# Patient Record
Sex: Male | Born: 1970 | Race: White | Hispanic: No | Marital: Single | Smoking: Current every day smoker
Health system: Southern US, Community
[De-identification: ages and names within clinical notes are randomized; demographics above are authoritative.]

## PROBLEM LIST (undated history)

## (undated) DIAGNOSIS — I10 Essential (primary) hypertension: Secondary | ICD-10-CM

## (undated) DIAGNOSIS — I639 Cerebral infarction, unspecified: Secondary | ICD-10-CM

---

## 2020-12-07 ENCOUNTER — Emergency Department (HOSPITAL_COMMUNITY): Payer: PRIVATE HEALTH INSURANCE

## 2020-12-07 ENCOUNTER — Inpatient Hospital Stay (HOSPITAL_COMMUNITY)
Admission: EM | Admit: 2020-12-07 | Discharge: 2020-12-09 | DRG: 975 | Disposition: A | Discharge status: Home / Self Care | Payer: PRIVATE HEALTH INSURANCE | Attending: Internal Medicine | Admitting: Internal Medicine

## 2020-12-07 ENCOUNTER — Other Ambulatory Visit: Payer: Self-pay

## 2020-12-07 ENCOUNTER — Encounter (HOSPITAL_COMMUNITY): Payer: Self-pay

## 2020-12-07 DIAGNOSIS — Z91128 Patient's intentional underdosing of medication regimen for other reason: Secondary | ICD-10-CM | POA: Diagnosis not present

## 2020-12-07 DIAGNOSIS — E785 Hyperlipidemia, unspecified: Secondary | ICD-10-CM

## 2020-12-07 DIAGNOSIS — I10 Essential (primary) hypertension: Secondary | ICD-10-CM | POA: Diagnosis present

## 2020-12-07 DIAGNOSIS — F419 Anxiety disorder, unspecified: Secondary | ICD-10-CM

## 2020-12-07 DIAGNOSIS — Z8673 Personal history of transient ischemic attack (TIA), and cerebral infarction without residual deficits: Secondary | ICD-10-CM

## 2020-12-07 DIAGNOSIS — A419 Sepsis, unspecified organism: Principal | ICD-10-CM | POA: Diagnosis present

## 2020-12-07 DIAGNOSIS — Z20822 Contact with and (suspected) exposure to covid-19: Secondary | ICD-10-CM | POA: Diagnosis present

## 2020-12-07 DIAGNOSIS — Z21 Asymptomatic human immunodeficiency virus [HIV] infection status: Secondary | ICD-10-CM

## 2020-12-07 DIAGNOSIS — T368X6A Underdosing of other systemic antibiotics, initial encounter: Secondary | ICD-10-CM | POA: Diagnosis present

## 2020-12-07 DIAGNOSIS — T50996A Underdosing of other drugs, medicaments and biological substances, initial encounter: Secondary | ICD-10-CM | POA: Diagnosis present

## 2020-12-07 DIAGNOSIS — E872 Acidosis: Secondary | ICD-10-CM | POA: Diagnosis present

## 2020-12-07 DIAGNOSIS — J189 Pneumonia, unspecified organism: Secondary | ICD-10-CM | POA: Diagnosis present

## 2020-12-07 DIAGNOSIS — F17210 Nicotine dependence, cigarettes, uncomplicated: Secondary | ICD-10-CM | POA: Diagnosis present

## 2020-12-07 DIAGNOSIS — Z720 Tobacco use: Secondary | ICD-10-CM | POA: Diagnosis not present

## 2020-12-07 DIAGNOSIS — B370 Candidal stomatitis: Secondary | ICD-10-CM | POA: Diagnosis present

## 2020-12-07 DIAGNOSIS — Z79899 Other long term (current) drug therapy: Secondary | ICD-10-CM

## 2020-12-07 DIAGNOSIS — B20 Human immunodeficiency virus [HIV] disease: Secondary | ICD-10-CM | POA: Diagnosis present

## 2020-12-07 DIAGNOSIS — Y92009 Unspecified place in unspecified non-institutional (private) residence as the place of occurrence of the external cause: Secondary | ICD-10-CM

## 2020-12-07 DIAGNOSIS — R6520 Severe sepsis without septic shock: Secondary | ICD-10-CM | POA: Diagnosis present

## 2020-12-07 HISTORY — DX: Essential (primary) hypertension: I10

## 2020-12-07 HISTORY — DX: Cerebral infarction, unspecified: I63.9

## 2020-12-07 LAB — LACTIC ACID, PLASMA
Lactic Acid, Venous: 2.3 mmol/L (ref 0.5–1.9)
Lactic Acid, Venous: 2.2 mmol/L (ref 0.5–1.9)

## 2020-12-07 LAB — CBC WITH DIFFERENTIAL/PLATELET
Abs Immature Granulocytes: 0.15 10*3/uL — ABNORMAL HIGH (ref 0.00–0.07)
Basophils Absolute: 0 10*3/uL (ref 0.0–0.1)
Basophils Relative: 0 %
Eosinophils Absolute: 0.1 10*3/uL (ref 0.0–0.5)
Eosinophils Relative: 0 %
HCT: 46.2 % (ref 39.0–52.0)
Hemoglobin: 16.2 g/dL (ref 13.0–17.0)
Immature Granulocytes: 1 %
Lymphocytes Relative: 5 %
Lymphs Abs: 0.8 10*3/uL (ref 0.7–4.0)
MCH: 32.5 pg (ref 26.0–34.0)
MCHC: 35.1 g/dL (ref 30.0–36.0)
MCV: 92.8 fL (ref 80.0–100.0)
Monocytes Absolute: 1.2 10*3/uL — ABNORMAL HIGH (ref 0.1–1.0)
Monocytes Relative: 7 %
Neutro Abs: 14.2 10*3/uL — ABNORMAL HIGH (ref 1.7–7.7)
Neutrophils Relative %: 87 %
Platelets: 132 10*3/uL — ABNORMAL LOW (ref 150–400)
RBC: 4.98 MIL/uL (ref 4.22–5.81)
RDW: 13.2 % (ref 11.5–15.5)
WBC: 16.5 10*3/uL — ABNORMAL HIGH (ref 4.0–10.5)
nRBC: 0 % (ref 0.0–0.2)

## 2020-12-07 LAB — URINALYSIS, ROUTINE W REFLEX MICROSCOPIC
Bacteria, UA: NONE SEEN
Bilirubin Urine: NEGATIVE
Glucose, UA: NEGATIVE mg/dL
Ketones, ur: NEGATIVE mg/dL
Leukocytes,Ua: NEGATIVE
Nitrite: NEGATIVE
Protein, ur: NEGATIVE mg/dL
Specific Gravity, Urine: 1.013 (ref 1.005–1.030)
pH: 5 (ref 5.0–8.0)

## 2020-12-07 LAB — EXPECTORATED SPUTUM ASSESSMENT W GRAM STAIN, RFLX TO RESP C

## 2020-12-07 LAB — COMPREHENSIVE METABOLIC PANEL
ALT: 40 U/L (ref 0–44)
AST: 55 U/L — ABNORMAL HIGH (ref 15–41)
Albumin: 3.9 g/dL (ref 3.5–5.0)
Alkaline Phosphatase: 57 U/L (ref 38–126)
Anion gap: 9 (ref 5–15)
BUN: 6 mg/dL (ref 6–20)
CO2: 22 mmol/L (ref 22–32)
Calcium: 8.6 mg/dL — ABNORMAL LOW (ref 8.9–10.3)
Chloride: 101 mmol/L (ref 98–111)
Creatinine, Ser: 0.86 mg/dL (ref 0.61–1.24)
GFR, Estimated: >60 mL/min (ref >60)
Glucose, Bld: 102 mg/dL — ABNORMAL HIGH (ref 70–99)
Potassium: 3.9 mmol/L (ref 3.5–5.1)
Sodium: 132 mmol/L — ABNORMAL LOW (ref 135–145)
Total Bilirubin: 0.7 mg/dL (ref 0.3–1.2)
Total Protein: 8.9 g/dL — ABNORMAL HIGH (ref 6.5–8.1)

## 2020-12-07 LAB — MRSA PCR SCREENING: MRSA by PCR: NEGATIVE

## 2020-12-07 LAB — HIV ANTIBODY (ROUTINE TESTING W REFLEX): HIV Screen 4th Generation wRfx: REACTIVE — AB

## 2020-12-07 LAB — RESP PANEL BY RT-PCR (FLU A&B, COVID) ARPGX2
Influenza A by PCR: NEGATIVE
Influenza B by PCR: NEGATIVE
SARS Coronavirus 2 by RT PCR: NEGATIVE

## 2020-12-07 LAB — T-HELPER CELLS (CD4) COUNT (NOT AT ARMC)
CD4 % Helper T Cell: 7 % — ABNORMAL LOW (ref 33–65)
CD4 T Cell Abs: 83 /uL — ABNORMAL LOW (ref 400–1790)

## 2020-12-07 LAB — APTT: aPTT: 30 seconds (ref 24–36)

## 2020-12-07 LAB — PROTIME-INR
INR: 1 (ref 0.8–1.2)
Prothrombin Time: 13 seconds (ref 11.4–15.2)

## 2020-12-07 MED ORDER — NYSTATIN 100000 UNIT/ML MT SUSP
5.0000 mL | Freq: Four times a day (QID) | OROMUCOSAL | Status: DC
  Administered 2020-12-07 – 2020-12-08 (×7): 500000 [IU] via ORAL
  Filled 2020-12-07 (×7): qty 5

## 2020-12-07 MED ORDER — SODIUM CHLORIDE 0.9 % IV SOLN
500.0000 mg | INTRAVENOUS | Status: DC
  Administered 2020-12-08: 500 mg via INTRAVENOUS
  Filled 2020-12-07 (×2): qty 500

## 2020-12-07 MED ORDER — SODIUM CHLORIDE 0.9 % IV SOLN
1.0000 g | Freq: Once | INTRAVENOUS | Status: AC
  Administered 2020-12-07: 1 g via INTRAVENOUS
  Filled 2020-12-07: qty 10

## 2020-12-07 MED ORDER — SODIUM CHLORIDE 0.9 % IV BOLUS
250.0000 mL | Freq: Once | INTRAVENOUS | Status: AC
  Administered 2020-12-07: 250 mL via INTRAVENOUS

## 2020-12-07 MED ORDER — METOPROLOL TARTRATE 5 MG/5ML IV SOLN
2.5000 mg | Freq: Once | INTRAVENOUS | Status: AC
  Administered 2020-12-07: 2.5 mg via INTRAVENOUS
  Filled 2020-12-07: qty 5

## 2020-12-07 MED ORDER — HYDROXYZINE PAMOATE 25 MG PO CAPS
25.0000 mg | ORAL_CAPSULE | Freq: Three times a day (TID) | ORAL | Status: DC
  Filled 2020-12-07 (×2): qty 1

## 2020-12-07 MED ORDER — HYDROXYZINE HCL 25 MG PO TABS
25.0000 mg | ORAL_TABLET | Freq: Three times a day (TID) | ORAL | Status: DC
  Administered 2020-12-07 – 2020-12-08 (×5): 25 mg via ORAL
  Filled 2020-12-07 (×5): qty 1

## 2020-12-07 MED ORDER — SODIUM CHLORIDE 0.9 % IV SOLN: 2.0000 g | INTRAVENOUS | Status: DC

## 2020-12-07 MED ORDER — ORAL CARE MOUTH RINSE
15.0000 mL | Freq: Two times a day (BID) | OROMUCOSAL | Status: DC
  Administered 2020-12-07 – 2020-12-08 (×4): 15 mL via OROMUCOSAL

## 2020-12-07 MED ORDER — SODIUM CHLORIDE 0.9 % IV SOLN
500.0000 mg | Freq: Once | INTRAVENOUS | Status: AC
  Administered 2020-12-07: 500 mg via INTRAVENOUS
  Filled 2020-12-07: qty 500

## 2020-12-07 MED ORDER — ENOXAPARIN SODIUM 40 MG/0.4ML ~~LOC~~ SOLN
40.0000 mg | SUBCUTANEOUS | Status: DC
  Administered 2020-12-07 – 2020-12-08 (×2): 40 mg via SUBCUTANEOUS
  Filled 2020-12-07 (×2): qty 0.4

## 2020-12-07 MED ORDER — DARUN-COBIC-EMTRICIT-TENOFAF 800-150-200-10 MG PO TABS
1.0000 | ORAL_TABLET | Freq: Every day | ORAL | Status: DC
  Administered 2020-12-08: 1 via ORAL
  Filled 2020-12-07 (×2): qty 1

## 2020-12-07 MED ORDER — SODIUM CHLORIDE 0.9 % IV SOLN
2.0000 g | Freq: Three times a day (TID) | INTRAVENOUS | Status: DC
  Administered 2020-12-07 – 2020-12-09 (×6): 2 g via INTRAVENOUS
  Filled 2020-12-07 (×7): qty 2

## 2020-12-07 MED ORDER — NICOTINE 21 MG/24HR TD PT24
21.0000 mg | MEDICATED_PATCH | Freq: Every day | TRANSDERMAL | Status: DC
  Administered 2020-12-07 – 2020-12-08 (×2): 21 mg via TRANSDERMAL
  Filled 2020-12-07 (×2): qty 1

## 2020-12-07 MED ORDER — VANCOMYCIN HCL 1500 MG/300ML IV SOLN
1500.0000 mg | Freq: Once | INTRAVENOUS | Status: AC
  Administered 2020-12-07: 1500 mg via INTRAVENOUS
  Filled 2020-12-07: qty 300

## 2020-12-07 MED ORDER — SODIUM CHLORIDE 0.9 % IV BOLUS
2000.0000 mL | Freq: Once | INTRAVENOUS | Status: AC
  Administered 2020-12-07: 2000 mL via INTRAVENOUS

## 2020-12-07 MED ORDER — ALBUTEROL SULFATE HFA 108 (90 BASE) MCG/ACT IN AERS
2.0000 | INHALATION_SPRAY | RESPIRATORY_TRACT | Status: DC | PRN
  Filled 2020-12-07: qty 6.7

## 2020-12-07 MED ORDER — ACETAMINOPHEN 325 MG PO TABS
650.0000 mg | ORAL_TABLET | Freq: Once | ORAL | Status: AC
  Administered 2020-12-07: 650 mg via ORAL
  Filled 2020-12-07: qty 2

## 2020-12-07 MED ORDER — ONDANSETRON HCL 4 MG/2ML IJ SOLN
4.0000 mg | Freq: Three times a day (TID) | INTRAMUSCULAR | Status: DC | PRN
  Filled 2020-12-07: qty 2

## 2020-12-07 MED ORDER — VANCOMYCIN HCL 1250 MG/250ML IV SOLN
1250.0000 mg | Freq: Two times a day (BID) | INTRAVENOUS | Status: DC
  Administered 2020-12-08: 1250 mg via INTRAVENOUS
  Filled 2020-12-07 (×2): qty 250

## 2020-12-07 MED ORDER — SODIUM CHLORIDE 0.9 % IV BOLUS (SEPSIS)
500.0000 mL | Freq: Once | INTRAVENOUS | Status: AC
  Administered 2020-12-07: 500 mL via INTRAVENOUS

## 2020-12-07 MED ORDER — ATORVASTATIN CALCIUM 40 MG PO TABS
80.0000 mg | ORAL_TABLET | Freq: Every day | ORAL | Status: DC
  Administered 2020-12-07 – 2020-12-08 (×2): 80 mg via ORAL
  Filled 2020-12-07 (×2): qty 2

## 2020-12-07 MED ORDER — METOPROLOL TARTRATE 5 MG/5ML IV SOLN
5.0000 mg | Freq: Three times a day (TID) | INTRAVENOUS | Status: DC | PRN
  Administered 2020-12-07: 5 mg via INTRAVENOUS
  Filled 2020-12-07 (×2): qty 5

## 2020-12-07 NOTE — ED Notes (Signed)
Abnormal lab: Lactic Acid 2.3 Notified Dr. Estell Harpin

## 2020-12-07 NOTE — Progress Notes (Signed)
   12/07/20 1448  Vitals  Temp 99.6 F (37.6 C)  BP (!) 166/97  MAP (mmHg) 115  BP Location Left Arm  BP Method Automatic  Patient Position (if appropriate) Lying  Pulse Rate (!) 122  Pulse Rate Source Monitor  Resp 18  Oxygen Therapy  SpO2 96 %  MEWS Score  MEWS Temp 0  MEWS Systolic 0  MEWS Pulse 2  MEWS RR 0  MEWS LOC 0  MEWS Score 2  MEWS Score Color Yellow    Pt HR increased again. MD aware and advised Metoprolol 5mg  PRN for HR greater than 120. Pt remains asymptomatic. Metoprolol given and I will continue to monitor.        12/07/2020,3:06 PM

## 2020-12-07 NOTE — Progress Notes (Signed)
   12/07/20 1600  Vitals  ECG Heart Rate (!) 118  Resp (!) 25  MEWS Score  MEWS Temp 0  MEWS Systolic 0  MEWS Pulse 2  MEWS RR 1  MEWS LOC 0  MEWS Score 3  MEWS Score Color Yellow  Pt resting comfortably in bed in no apparent distress. MD made aware of increased RR and sustained HR despite Metoprolol. Bolus of IVF ordered. I am at bedside and will continue to monitor patient.

## 2020-12-07 NOTE — ED Notes (Signed)
ED TO INPATIENT HANDOFF REPORT  Name/Age/Gender Taylor James 50 y.o. male  Code Status    Code Status Orders  (From admission, onward)         Start     Ordered   12/07/20 0959  Full code  Continuous        12/07/20 0958        Code Status History    This patient has a current code status but no historical code status.   Advance Care Planning Activity      Home/SNF/Other Home  Chief Complaint Severe sepsis (HCC) [A41.9, R65.20]  Level of Care/Admitting Diagnosis ED Disposition    ED Disposition Condition Comment   Admit  Hospital Area: [facility] [[id number]]  Level of Care: Telemetry [5]  Admit to tele based on following criteria: Complex arrhythmia (Bradycardia/Tachycardia)  May admit patient to [facility] or [facility] if equivalent level of care is available:: Yes  Covid Evaluation: Confirmed COVID Negative  Diagnosis: Severe sepsis [facility]) [[id number]]  Admitting Physician: Jae Dire [[id number]]  Attending Physician: Jae Dire [[id number]]  Estimated length of stay: past midnight tomorrow  Certification:: I certify this patient will need inpatient services for at least 2 midnights       Medical History Past Medical History:  Diagnosis Date  . Hypertension   . Stroke [facility])    2020    Allergies No Known Allergies  IV Location/Drains/Wounds Patient Lines/Drains/Airways Status    Active Line/Drains/Airways    Name Placement date Placement time Site Days   Peripheral IV 12/07/20 Right Antecubital 12/07/20  0722  Antecubital  less than 1          Labs/Imaging Results for orders placed or performed during the hospital encounter of 12/07/20 (from the past 48 hour(s))  Comprehensive metabolic panel     Status: Abnormal   Collection Time: 12/07/20  7:30 AM  Result Value Ref Range   Sodium 132 (L) 135 - 145 mmol/L   Potassium 3.9 3.5 - 5.1 mmol/L   Chloride 101 98 - 111 mmol/L   CO2 22 22 - 32 mmol/L   Glucose, Bld 102 (H) 70  - 99 mg/dL    Comment: Glucose reference range applies only to samples taken after fasting for at least 8 hours.   BUN 6 6 - 20 mg/dL   Creatinine, Ser 4.62 0.61 - 1.24 mg/dL   Calcium 8.6 (L) 8.9 - 10.3 mg/dL   Total Protein 8.9 (H) 6.5 - 8.1 g/dL   Albumin 3.9 3.5 - 5.0 g/dL   AST 55 (H) 15 - 41 U/L   ALT 40 0 - 44 U/L   Alkaline Phosphatase 57 38 - 126 U/L   Total Bilirubin 0.7 0.3 - 1.2 mg/dL   GFR, Estimated >70 >35 mL/min    Comment: (NOTE) Calculated using the CKD-EPI Creatinine Equation (2021)    Anion gap 9 5 - 15    Comment: Performed at [facility], [address], [place], [place] [address]  CBC WITH DIFFERENTIAL     Status: Abnormal   Collection Time: 12/07/20  7:30 AM  Result Value Ref Range   WBC 16.5 (H) 4.0 - 10.5 K/uL   RBC 4.98 4.22 - 5.81 MIL/uL   Hemoglobin 16.2 13.0 - 17.0 g/dL   HCT 18.2 99.3 - 71.6 %   MCV 92.8 80.0 - 100.0 fL   MCH 32.5 26.0 - 34.0 pg   MCHC 35.1 30.0 - 36.0 g/dL  RDW 13.2 11.5 - 15.5 %   Platelets 132 (L) 150 - 400 K/uL   nRBC 0.0 0.0 - 0.2 %   Neutrophils Relative % 87 %   Neutro Abs 14.2 (H) 1.7 - 7.7 K/uL   Lymphocytes Relative 5 %   Lymphs Abs 0.8 0.7 - 4.0 K/uL   Monocytes Relative 7 %   Monocytes Absolute 1.2 (H) 0.1 - 1.0 K/uL   Eosinophils Relative 0 %   Eosinophils Absolute 0.1 0.0 - 0.5 K/uL   Basophils Relative 0 %   Basophils Absolute 0.0 0.0 - 0.1 K/uL   Immature Granulocytes 1 %   Abs Immature Granulocytes 0.15 (H) 0.00 - 0.07 K/uL    Comment: Performed at [facility], [address], [place], [place] [address]  Protime-INR     Status: None   Collection Time: 12/07/20  7:30 AM  Result Value Ref Range   Prothrombin Time 13.0 11.4 - 15.2 seconds   INR 1.0 0.8 - 1.2    Comment: (NOTE) INR goal varies based on device and disease states. Performed at [facility], [address] [place], [place] [address]   APTT     Status: None   Collection Time:  12/07/20  7:30 AM  Result Value Ref Range   aPTT 30 24 - 36 seconds    Comment: Performed at [facility], [address] [place], [place] [address]  Resp Panel by RT-PCR (Flu A&B, Covid) Nasopharyngeal Swab     Status: None   Collection Time: 12/07/20  7:50 AM   Specimen: Nasopharyngeal Swab; Nasopharyngeal(NP) swabs in vial transport medium  Result Value Ref Range   SARS Coronavirus 2 by RT PCR NEGATIVE NEGATIVE    Comment: (NOTE) SARS-CoV-2 target nucleic acids are NOT DETECTED.  The SARS-CoV-2 RNA is generally detectable in upper respiratory specimens during the acute phase of infection. The lowest concentration of SARS-CoV-2 viral copies this assay can detect is 138 copies/mL. A negative result does not preclude SARS-Cov-2 infection and should not be used as the sole basis for treatment or other patient management decisions. A negative result may occur with  improper specimen collection/handling, submission of specimen other than nasopharyngeal swab, presence of viral mutation(s) within the areas targeted by this assay, and inadequate number of viral copies(<138 copies/mL). A negative result must be combined with clinical observations, patient history, and epidemiological information. The expected result is Negative.  Fact Sheet for Patients:  BloggerCourse.com  Fact Sheet for Healthcare Providers:  SeriousBroker.it  This test is no t yet approved or cleared by the [place] FDA and  has been authorized for detection and/or diagnosis of SARS-CoV-2 by FDA under an Emergency Use Authorization (EUA). This EUA will remain  in effect (meaning this test can be used) for the duration of the COVID-19 declaration under Section 564(b)(1) of the Act, 21 U.S.C.section 360bbb-3(b)(1), unless the authorization is terminated  or revoked sooner.       Influenza A by PCR NEGATIVE NEGATIVE   Influenza B by PCR  NEGATIVE NEGATIVE    Comment: (NOTE) The Xpert Xpress SARS-CoV-2/FLU/RSV plus assay is intended as an aid in the diagnosis of influenza from Nasopharyngeal swab specimens and should not be used as a sole basis for treatment. Nasal washings and aspirates are unacceptable for Xpert Xpress SARS-CoV-2/FLU/RSV testing.  Fact Sheet for Patients: BloggerCourse.com  Fact Sheet for Healthcare Providers: SeriousBroker.it  This test is not yet approved or cleared by the [place] FDA  and has been authorized for detection and/or diagnosis of SARS-CoV-2 by FDA under an Emergency Use Authorization (EUA). This EUA will remain in effect (meaning this test can be used) for the duration of the COVID-19 declaration under Section 564(b)(1) of the Act, 21 U.S.C. section 360bbb-3(b)(1), unless the authorization is terminated or revoked.  Performed at [facility], [address] [place], [place] [address]   Lactic acid, plasma     Status: Abnormal   Collection Time: 12/07/20  8:25 AM  Result Value Ref Range   Lactic Acid, Venous 2.3 (HH) 0.5 - 1.9 mmol/L    Comment: CRITICAL RESULT CALLED TO, READ BACK BY AND VERIFIED WITH: Lewanna Petrak,E. RN AT 0901 12/07/20 MULLINS,T Performed at [facility], [address], [place], [place] [address]    DG Chest 2 View  Result Date: 12/07/2020 CLINICAL DATA:  Shortness of breath. Similar symptoms to prior pneumonia HIV EXAM: CHEST - 2 VIEW COMPARISON:  None. FINDINGS: Airspace opacity at the right middle lobe. No edema effusion, or pneumothorax. Broad appearance of the right first rib which may be from prior trauma. No bony destruction IMPRESSION: Right middle lobe pneumonia. Followup PA and lateral chest X-ray is recommended in 3-4 weeks following trial of antibiotic therapy to ensure resolution. Electronically Signed   By: Marnee Spring M.D.   On: 12/07/2020 08:39     Pending Labs Unresulted Labs (From admission, onward)          Start     Ordered   12/07/20 0959  HIV Antibody (routine testing w rflx)  (HIV Antibody (Routine testing w reflex) panel)  Once,   STAT        12/07/20 0958   12/07/20 0750  Urine culture  (Undifferentiated presentation (screening labs and basic nursing orders))  ONCE - STAT,   STAT        12/07/20 0749   12/07/20 0749  Lactic acid, plasma  (Undifferentiated presentation (screening labs and basic nursing orders))  Now then every 2 hours,   STAT      12/07/20 0749   12/07/20 0749  Blood culture (routine single)  (Undifferentiated presentation (screening labs and basic nursing orders))  ONCE - STAT,   STAT        12/07/20 0749   12/07/20 0749  Urinalysis, Routine w reflex microscopic  (Undifferentiated presentation (screening labs and basic nursing orders))  ONCE - STAT,   STAT        12/07/20 0749          Vitals/Pain Today's Vitals   12/07/20 0725 12/07/20 0800 12/07/20 0804 12/07/20 0900  BP: 135/89 (!) 89/58 118/68 111/80  Pulse: (!) 135 (!) 157 (!) 136 (!) 117  Resp: (!) 23 (!) 22 (!) 25 20  Temp:      TempSrc:      SpO2: 93% 95% 96% 92%    Isolation Precautions Airborne and Contact precautions  Medications Medications  albuterol (VENTOLIN HFA) 108 (90 Base) MCG/ACT inhaler 2 puff (has no administration in time range)  azithromycin (ZITHROMAX) 500 mg in sodium chloride 0.9 % 250 mL IVPB (has no administration in time range)  enoxaparin (LOVENOX) injection 40 mg (has no administration in time range)  sodium chloride 0.9 % bolus 500 mL (has no administration in time range)  cefTRIAXone (ROCEPHIN) 2 g in sodium chloride 0.9 % 100 mL IVPB (has no administration in time range)  azithromycin (ZITHROMAX) 500 mg in sodium chloride 0.9 % 250 mL IVPB (has  no administration in time range)  nicotine (NICODERM CQ - dosed in mg/24 hours) patch 21 mg (has no administration in time range)  acetaminophen (TYLENOL)  tablet 650 mg (650 mg Oral Given 12/07/20 0800)  sodium chloride 0.9 % bolus 2,000 mL (0 mLs Intravenous Stopped 12/07/20 0948)  cefTRIAXone (ROCEPHIN) 1 g in sodium chloride 0.9 % 100 mL IVPB (1 g Intravenous New Bag/Given 12/07/20 0947)    Mobility walks

## 2020-12-07 NOTE — Progress Notes (Signed)
Patient requesting something for nausea.  No PRN noted.  [place] notified.

## 2020-12-07 NOTE — ED Notes (Signed)
Lab called to add previously sent tubes to orders

## 2020-12-07 NOTE — ED Provider Notes (Signed)
[facility]-EMERGENCY DEPT Provider Note   CSN: [id number] Arrival date & time: 12/07/20  0703     History Chief Complaint  Patient presents with  . Shortness of Breath  . URI    Taylor James is a 50 y.o. male.  Patient complains of cough shortness of breath weakness.  Patient states that he has had pneumonia before the believes he has it again.  Patient is a smoker  The history is provided by the patient and medical records. No language interpreter was used.  Shortness of Breath Severity:  Moderate Onset quality:  Sudden Timing:  Constant Progression:  Worsening Chronicity:  New Context: not activity   Relieved by:  Nothing Ineffective treatments:  None tried Associated symptoms: cough   Associated symptoms: no abdominal pain, no chest pain, no headaches and no rash   Risk factors: no recent alcohol use   URI Presenting symptoms: cough   Presenting symptoms: no congestion and no fatigue   Associated symptoms: no headaches        Past Medical History:  Diagnosis Date  . Hypertension   . Stroke [facility])    2020    Patient Active Problem List   Diagnosis Date Noted  . Severe sepsis (HCC) 12/07/2020    History reviewed. No pertinent surgical history.     History reviewed. No pertinent family history.  Social History   Tobacco Use  . Smoking status: Current Every Day Smoker    Packs/day: 1.50    Types: Cigarettes  . Smokeless tobacco: Never Used    Home Medications Prior to Admission medications   Not on File    Allergies    Patient has no known allergies.  Review of Systems   Review of Systems  Constitutional: Negative for appetite change and fatigue.  HENT: Negative for congestion, ear discharge and sinus pressure.   Eyes: Negative for discharge.  Respiratory: Positive for cough and shortness of breath.   Cardiovascular: Negative for chest pain.  Gastrointestinal: Negative for abdominal pain and diarrhea.   Genitourinary: Negative for frequency and hematuria.  Musculoskeletal: Negative for back pain.  Skin: Negative for rash.  Neurological: Negative for seizures and headaches.  Psychiatric/Behavioral: Negative for hallucinations.    Physical Exam Updated Vital Signs BP 111/80   Pulse (!) 117   Temp 98.6 F (37 C) (Oral)   Resp 20   SpO2 92%   Physical Exam Vitals reviewed.  Constitutional:      Appearance: He is well-developed.  HENT:     Head: Normocephalic.     Nose: Nose normal.  Eyes:     General: No scleral icterus.    Conjunctiva/sclera: Conjunctivae normal.  Neck:     Thyroid: No thyromegaly.  Cardiovascular:     Rate and Rhythm: Regular rhythm. Tachycardia present.     Heart sounds: No murmur heard. No friction rub. No gallop.   Pulmonary:     Breath sounds: No stridor. No wheezing or rales.  Chest:     Chest wall: No tenderness.  Abdominal:     General: There is no distension.     Tenderness: There is no abdominal tenderness. There is no rebound.  Musculoskeletal:        General: Normal range of motion.     Cervical back: Neck supple.  Lymphadenopathy:     Cervical: No cervical adenopathy.  Skin:    Findings: No erythema or rash.  Neurological:     Mental Status: He is oriented to person,  place, and time.     Motor: No abnormal muscle tone.     Coordination: Coordination normal.  Psychiatric:        Behavior: Behavior normal.     ED Results / Procedures / Treatments   Labs (all labs ordered are listed, but only abnormal results are displayed) Labs Reviewed  LACTIC ACID, PLASMA - Abnormal; Notable for the following components:      Result Value   Lactic Acid, Venous 2.3 (*)    All other components within normal limits  COMPREHENSIVE METABOLIC PANEL - Abnormal; Notable for the following components:   Sodium 132 (*)    Glucose, Bld 102 (*)    Calcium 8.6 (*)    Total Protein 8.9 (*)    AST 55 (*)    All other components within normal limits   CBC WITH DIFFERENTIAL/PLATELET - Abnormal; Notable for the following components:   WBC 16.5 (*)    Platelets 132 (*)    Neutro Abs 14.2 (*)    Monocytes Absolute 1.2 (*)    Abs Immature Granulocytes 0.15 (*)    All other components within normal limits  RESP PANEL BY RT-PCR (FLU A&B, COVID) ARPGX2  CULTURE, BLOOD (SINGLE)  URINE CULTURE  PROTIME-INR  APTT  LACTIC ACID, PLASMA  URINALYSIS, ROUTINE W REFLEX MICROSCOPIC    EKG None  Radiology DG Chest 2 View  Result Date: 12/07/2020 CLINICAL DATA:  Shortness of breath. Similar symptoms to prior pneumonia HIV EXAM: CHEST - 2 VIEW COMPARISON:  None. FINDINGS: Airspace opacity at the right middle lobe. No edema effusion, or pneumothorax. Broad appearance of the right first rib which may be from prior trauma. No bony destruction IMPRESSION: Right middle lobe pneumonia. Followup PA and lateral chest X-ray is recommended in 3-4 weeks following trial of antibiotic therapy to ensure resolution. Electronically Signed   By: Marnee Spring M.D.   On: 12/07/2020 08:39    Procedures Procedures   Medications Ordered in ED Medications  albuterol (VENTOLIN HFA) 108 (90 Base) MCG/ACT inhaler 2 puff (has no administration in time range)  cefTRIAXone (ROCEPHIN) 1 g in sodium chloride 0.9 % 100 mL IVPB (has no administration in time range)  azithromycin (ZITHROMAX) 500 mg in sodium chloride 0.9 % 250 mL IVPB (has no administration in time range)  acetaminophen (TYLENOL) tablet 650 mg (650 mg Oral Given 12/07/20 0800)  sodium chloride 0.9 % bolus 2,000 mL (2,000 mLs Intravenous New Bag/Given 12/07/20 1610)    ED Course  I have reviewed the triage vital signs and the nursing notes.  Pertinent labs & imaging results that were available during my care of the patient were reviewed by me and considered in my medical decision making (see chart for details). CRITICAL CARE Performed by: Bethann Berkshire Total critical care time: 40 minutes Critical care  time was exclusive of separately billable procedures and treating other patients. Critical care was necessary to treat or prevent imminent or life-threatening deterioration. Critical care was time spent personally by me on the following activities: development of treatment plan with patient and/or surrogate as well as nursing, discussions with consultants, evaluation of patient's response to treatment, examination of patient, obtaining history from patient or surrogate, ordering and performing treatments and interventions, ordering and review of laboratory studies, ordering and review of radiographic studies, pulse oximetry and re-evaluation of patient's condition.    MDM Rules/Calculators/A&P  Patient with pneumonia and significant tachycardia.  He will be admitted to the hospital and given IV antibiotic Final Clinical Impression(s) / ED Diagnoses Final diagnoses:  Community acquired pneumonia of right middle lobe of lung    Rx / DC Orders ED Discharge Orders    None       Bethann Berkshire, MD 12/07/20 [phone]

## 2020-12-07 NOTE — H&P (Addendum)
History and Physical        Hospital Admission Note Date: 12/07/2020  Patient name: Taylor James Medical record number: [id number] Date of birth: 22-Sep-1970 Age: 50 y.o. Gender: male  PCP: Pcp, No    Chief Complaint    Chief Complaint  Patient presents with  . Shortness of Breath  . URI      HPI:   This is a 50 year old male with past medical history of hypertension, tobacco abuse, HIV, anxiety, hyperlipidemia, CVA in 2020 who presented to the ED as a walk-in with complaints of productive cough, shortness of breath and chest pain since yesterday. Says that he has had pneumonia before and this felt like his prior symptoms. Admits to subjective fevers, sweats, productive cough and chest pain with coughing. Also short of breath on exertion. Smokes 1.5 ppd of cigarettes. No known history of COPD or Asthma No recent sick contacts. He did travel to [place] yesterday by car and back to pick someone up. He lives in [place] but is here for work.    ED Course: Afebrile, tachycardic, tachypneic,  hemodynamically stable, on room air. Notable Labs: Sodium 132, K3.9, AST 55, ALT 40, WBC 16.5, Hb 16.2, platelets 132, lactic acid 2.3, Covid and flu negative. Notable Imaging: CXR-RML pneumonia. Patient received 2 L NS bolus, ceftriaxone and azithromycin.    Vitals:   12/07/20 1104 12/07/20 1206  BP: 120/79 135/84  Pulse: (!) 120 (!) 110  Resp: 18 18  Temp: 98.7 F (37.1 C)   SpO2: 94% 96%     Review of Systems:  Review of Systems  All other systems reviewed and are negative.   Medical/Social/Family History   Past Medical History: Past Medical History:  Diagnosis Date  . Hypertension   . Stroke [facility])    2020    History reviewed. No pertinent surgical history.  Medications: Prior to Admission medications   Not on File    Allergies:  No Known Allergies  Social  History:  reports that he has been smoking cigarettes. He has been smoking about 1.50 packs per day. He has never used smokeless tobacco. No history on file for alcohol use and drug use.  Family History: History reviewed. No pertinent family history.   Objective   Physical Exam: Blood pressure 135/84, pulse (!) 110, temperature 98.7 F (37.1 C), temperature source Oral, resp. rate 18, height 6' (1.829 m), weight 72.6 kg, SpO2 96 %.  Physical Exam Vitals and nursing note reviewed.  Constitutional:      Appearance: Normal appearance.  HENT:     Head: Normocephalic and atraumatic.  Eyes:     Conjunctiva/sclera: Conjunctivae normal.  Cardiovascular:     Rate and Rhythm: Normal rate and regular rhythm.  Pulmonary:     Effort: Pulmonary effort is normal.     Breath sounds: Wheezing present.  Abdominal:     General: Abdomen is flat.     Palpations: Abdomen is soft.  Musculoskeletal:        General: No swelling or tenderness.  Skin:    Coloration: Skin is not jaundiced or pale.  Neurological:     Mental Status: He is alert. Mental status is at baseline.  Psychiatric:  Mood and Affect: Mood normal.        Behavior: Behavior normal.     LABS on Admission: I have personally reviewed all the labs and imaging below    Basic Metabolic Panel: Recent Labs  Lab 12/07/20 0730  NA 132*  K 3.9  CL 101  CO2 22  GLUCOSE 102*  BUN 6  CREATININE 0.86  CALCIUM 8.6*   Liver Function Tests: Recent Labs  Lab 12/07/20 0730  AST 55*  ALT 40  ALKPHOS 57  BILITOT 0.7  PROT 8.9*  ALBUMIN 3.9   No results for input(s): LIPASE, AMYLASE in the last 168 hours. No results for input(s): AMMONIA in the last 168 hours. CBC: Recent Labs  Lab 12/07/20 0730  WBC 16.5*  NEUTROABS 14.2*  HGB 16.2  HCT 46.2  MCV 92.8  PLT 132*   Cardiac Enzymes: No results for input(s): CKTOTAL, CKMB, CKMBINDEX, TROPONINI in the last 168 hours. BNP: Invalid input(s): POCBNP CBG: No results  for input(s): GLUCAP in the last 168 hours.  Radiological Exams on Admission:  DG Chest 2 View  Result Date: 12/07/2020 CLINICAL DATA:  Shortness of breath. Similar symptoms to prior pneumonia HIV EXAM: CHEST - 2 VIEW COMPARISON:  None. FINDINGS: Airspace opacity at the right middle lobe. No edema effusion, or pneumothorax. Broad appearance of the right first rib which may be from prior trauma. No bony destruction IMPRESSION: Right middle lobe pneumonia. Followup PA and lateral chest X-ray is recommended in 3-4 weeks following trial of antibiotic therapy to ensure resolution. Electronically Signed   By: Marnee Spring M.D.   On: 12/07/2020 08:39      EKG: sinus tachycardia   A & P   Principal Problem:   Severe sepsis (HCC) Active Problems:   HIV (human immunodeficiency virus infection) (HCC)   Anxiety   Hyperlipidemia   Tobacco abuse   CAP (community acquired pneumonia)   1. Severe sepsis without septic shock secondary to RML CAP a. Sepsis criteria: Tachycardia, tachypnea, leukocytosis, lactic acidosis b. Received IV fluids in the ED c. Chest pain likely musculoskeletal from cough and pneumonia d. Broaden antibiotics to vancomycin and cefepime with azithromycin as he is immunocompromised from HIV and is not taking his HAART medications.   e. If no improvement, may need to consider Pneumocystis though he has lobar consolidation on CXR so less likely f. Trend lactic acid g. Follow-up blood cultures h. Sputum culture i. MRSA Nares  2. Hypertension, stable a. Hold lisinopril for now b. As needed Lopressor  3. Anxiety a. Continue home hydroxyzine  4. Hyperlipidemia a. Continue home statin  5. HIV a. On Symtuza but not compliant, took one week ago  b. On bactrim prophylaxis but ran out and last took two months ago c. Will check CD4 count and HIV viral load  6. Oral Thrush a. nystatin  7. Tobacco abuse a. Advised cessation b. Nicotine patch      DVT  prophylaxis: Lovenox   Code Status: Full Code  Diet: Heart healthy Family Communication: Admission, patients condition and plan of care including tests being ordered have been discussed with the patient who indicates understanding and agrees with the plan and Code Status.  Disposition Plan: The appropriate patient status for this patient is INPATIENT. Inpatient status is judged to be reasonable and necessary in order to provide the required intensity of service to ensure the patient's safety. The patient's presenting symptoms, physical exam findings, and initial radiographic and laboratory data in the context of  their chronic comorbidities is felt to place them at high risk for further clinical deterioration. Furthermore, it is not anticipated that the patient will be medically stable for discharge from the hospital within 2 midnights of admission. The following factors support the patient status of inpatient.   " The patient's presenting symptoms include cough, shortness of breath. " The worrisome physical exam findings include tachycardia. " The initial radiographic and laboratory data are worrisome because of pneumonia, lactic acidosis and leukocytosis. " The chronic co-morbidities include tobacco abuse.   * I certify that at the point of admission it is my clinical judgment that the patient will require inpatient hospital care spanning beyond 2 midnights from the point of admission due to high intensity of service, high risk for further deterioration and high frequency of surveillance required.*  Consultants  . none  Procedures  . none  Time Spent on Admission: 66 minutes    Jae Dire, DO [facility]  12/07/2020, 12:35 PM

## 2020-12-07 NOTE — Progress Notes (Signed)
Pharmacy Antibiotic Note  Reyes Aldaco is a 50 y.o. male admitted on 12/07/2020 with sepsis.  Pharmacy has been consulted for vanc/cefepime dosing and zithromax per Md  Plan: Vanc 1500mg  IV x 1 then 1250mg  IV q12 - goal AUC 400-550 Cefepime 2g IV q8 MRSA PCR - d/c vanc if negative and PNA is source of infection  Height: 6' (182.9 cm) Weight: 72.6 kg (160 lb 0.9 oz) IBW/kg (Calculated) : 77.6  Temp (24hrs), Avg:98.6 F (37 C), Min:98.4 F (36.9 C), Max:98.7 F (37.1 C)  Recent Labs  Lab 12/07/20 0730 12/07/20 0825  WBC 16.5*  --   CREATININE 0.86  --   LATICACIDVEN  --  2.3*    Estimated Creatinine Clearance: 109 mL/min (by C-G formula based on SCr of 0.86 mg/dL).    No Known Allergies   Thank you for allowing pharmacy to be a part of this patient's care.  12/09/20 12/07/2020 1:04 PM

## 2020-12-07 NOTE — Progress Notes (Signed)
   12/07/20 1206  Vitals  BP 135/84  MAP (mmHg) 98  BP Location Left Arm  BP Method Automatic  Patient Position (if appropriate) Lying  Pulse Rate (!) 110  Pulse Rate Source Monitor  Resp 18  Oxygen Therapy  SpO2 96 %  MEWS Score  MEWS Temp 0  MEWS Systolic 0  MEWS Pulse 1  MEWS RR 0  MEWS LOC 0  MEWS Score 1  MEWS Score Color Green  Pt received 1 dose of Metoprolol 2.5mg  IV. I re-evaluated VS 30 minutes after administration of medication. HR is improved. MEWS score now 1. Pt resting comfortably in bed in no apparent distress, eating his lunch. I will continue to monitor this pt with purposeful hourly rounding.

## 2020-12-07 NOTE — ED Notes (Signed)
IV start with lab draw, covid swab obtained.

## 2020-12-07 NOTE — ED Triage Notes (Signed)
Pt arrived via walk in, c/o productive cough, SOB, CP

## 2020-12-07 NOTE — Progress Notes (Addendum)
   12/07/20 1104  Vitals  Temp 98.7 F (37.1 C)  Temp Source Oral  BP 120/79  MAP (mmHg) 91  BP Location Left Arm  BP Method Automatic  Patient Position (if appropriate) Lying  Pulse Rate (!) 120  Pulse Rate Source Monitor  Resp 18  Oxygen Therapy  SpO2 94 %  MEWS Score  MEWS Temp 0  MEWS Systolic 0  MEWS Pulse 2  MEWS RR 0  MEWS LOC 0  MEWS Score 2  MEWS Score Color Yellow   I received this patient with a MEWS score of 2 from ED. I received report via secure chat and SBAR. Pt is in no apparent distress. Pt ambulated to bathroom and to bed without difficulty. Denies chest pain. MD made aware that pt has arrived to the unit and that HR is elevated. MD Dairl Ponder immediately ordered metoprolol. I will continue to monitor with hourly purposeful rounding.

## 2020-12-08 DIAGNOSIS — A419 Sepsis, unspecified organism: Principal | ICD-10-CM

## 2020-12-08 DIAGNOSIS — R6520 Severe sepsis without septic shock: Secondary | ICD-10-CM | POA: Diagnosis not present

## 2020-12-08 LAB — CBC WITH DIFFERENTIAL/PLATELET
Abs Immature Granulocytes: 0.09 10*3/uL — ABNORMAL HIGH (ref 0.00–0.07)
Basophils Absolute: 0.1 10*3/uL (ref 0.0–0.1)
Basophils Relative: 0 %
Eosinophils Absolute: 0 10*3/uL (ref 0.0–0.5)
Eosinophils Relative: 0 %
HCT: 41.8 % (ref 39.0–52.0)
Hemoglobin: 14.2 g/dL (ref 13.0–17.0)
Immature Granulocytes: 1 %
Lymphocytes Relative: 8 %
Lymphs Abs: 1.1 10*3/uL (ref 0.7–4.0)
MCH: 32.3 pg (ref 26.0–34.0)
MCHC: 34 g/dL (ref 30.0–36.0)
MCV: 95 fL (ref 80.0–100.0)
Monocytes Absolute: 0.7 10*3/uL (ref 0.1–1.0)
Monocytes Relative: 5 %
Neutro Abs: 12.7 10*3/uL — ABNORMAL HIGH (ref 1.7–7.7)
Neutrophils Relative %: 86 %
Platelets: 81 10*3/uL — ABNORMAL LOW (ref 150–400)
RBC: 4.4 MIL/uL (ref 4.22–5.81)
RDW: 12.9 % (ref 11.5–15.5)
WBC: 14.7 10*3/uL — ABNORMAL HIGH (ref 4.0–10.5)
nRBC: 0 % (ref 0.0–0.2)

## 2020-12-08 LAB — URINE CULTURE: Culture: NO GROWTH

## 2020-12-08 LAB — BASIC METABOLIC PANEL
Anion gap: 6 (ref 5–15)
BUN: 7 mg/dL (ref 6–20)
CO2: 24 mmol/L (ref 22–32)
Calcium: 7.9 mg/dL — ABNORMAL LOW (ref 8.9–10.3)
Chloride: 105 mmol/L (ref 98–111)
Creatinine, Ser: 0.79 mg/dL (ref 0.61–1.24)
GFR, Estimated: >60 mL/min (ref >60)
Glucose, Bld: 144 mg/dL — ABNORMAL HIGH (ref 70–99)
Potassium: 3.8 mmol/L (ref 3.5–5.1)
Sodium: 135 mmol/L (ref 135–145)

## 2020-12-08 LAB — HIV-1 RNA QUANT-NO REFLEX-BLD
HIV 1 RNA Quant: 118000 copies/mL
LOG10 HIV-1 RNA: 5.072 log10copy/mL

## 2020-12-08 LAB — LACTIC ACID, PLASMA: Lactic Acid, Venous: 1.9 mmol/L (ref 0.5–1.9)

## 2020-12-08 NOTE — Progress Notes (Signed)
PROGRESS NOTE  Taylor James  DOB: 12/10/1970  PCP: Kathyrn Lass XNA:[id number]  DOA: 12/07/2020  LOS: 1 day   Chief Complaint  Patient presents with  . Shortness of Breath  . URI   Brief narrative: Taylor James is a 50 y.o. male with PMH significant for HIV, HTN, HLD, CVA in 2020, anxiety disorder, current everyday smoker. Patient presented to the ED on 12/08/2018 with complaints of productive cough, shortness of breath and chest pain for 1 to 2 days which felt like his previous episodes of pneumonia.  Smoker 1/2 pack of cigarettes per day. In the ED, patient was afebrile, heart rate in 130s, tachypneic in 20s, blood pressure in 120s. Labs with sodium 132, renal function normal, WC count elevated to 16.5, lactic acid elevated to 2.3. Chest x-ray showed right middle lobe pneumonia. Patient was started on treatment for sepsis secondary to pneumonia and admitted to hospital service.  Subjective: Patient was seen and examined this morning.  Pleasant middle-aged Caucasian male.  Lying on bed.  Not in distress.  Feels better than at presentation.  Not on supplemental oxygen this morning. Chart reviewed No fever overnight.  Tachycardia improving, blood pressure stable.  Currently breathing on room air Labs pending this morning.    Assessment/Plan: Severe sepsis secondary to pneumonia -Immunocompromised patient presented with cough, shortness of breath, fever and has right middle lobe pneumonia on chest x-ray -Met severe sepsis criteria with tachycardia, tachypnea, leukocytosis and lactic acidosis. -Blood culture sent.  Started on broad-spectrum antibiotics with IV Vanco, IV cefepime and azithromycin.  Okay to stop IV vancomycin today.  Continue cefepime and azithromycin for now. -WBC count improving.  Lactic acid level back to normal now.  Continue oral hydration. Recent Labs  Lab 12/07/20 0730 12/07/20 0825 12/07/20 1246 12/08/20 0850  WBC 16.5*  --   --  14.7*  LATICACIDVEN  --  2.3*  2.2* 1.9   HIV -Noncompliant, supposed to be on Symtuza. -Says he ran out of Bactrim prophylaxis 2 months ago. -Ordered for CD4 count and HIV viral load  Oral thrush -On nystatin  Essential hypertension -Lisinopril is currently on hold.Marland Kitchen  History of CVA Hyperlipidemia -Continue home statin  Anxiety -Continue home hydroxyzine  Current everyday smoker -Smokes 1/2 pack/day.  Advised cessation -Nicotine patch offered  Mobility: Encourage ambulation Code Status:   Code Status: Full Code  Nutritional status: Body mass index is 21.71 kg/m.     Diet Order            Diet Heart Room service appropriate? Yes; Fluid consistency: Thin  Diet effective now                 DVT prophylaxis: enoxaparin (LOVENOX) injection 40 mg Start: 12/07/20 1000   Antimicrobials:  IV cefepime, IV azithromycin Fluid: None Consultants: None Family Communication:  None at bedside  Status is: Inpatient  Remains inpatient appropriate because: Improving sepsis, pending culture  Dispo: The patient is from: Home              Anticipated d/c is to: Home in 1 to 2 days              Patient currently is not medically stable to d/c.   Difficult to place patient No       Infusions:  . azithromycin 500 mg (12/08/20 0924)  . ceFEPime (MAXIPIME) IV 2 g (12/08/20 1334)    Scheduled Meds: . atorvastatin  80 mg Oral Daily  . Darunavir-Cobicisctat-Emtricitabine-Tenofovir Alafenamide  1 tablet  Oral Q breakfast  . enoxaparin (LOVENOX) injection  40 mg Subcutaneous Q24H  . hydrOXYzine  25 mg Oral TID  . mouth rinse  15 mL Mouth Rinse BID  . nicotine  21 mg Transdermal Daily  . nystatin  5 mL Oral QID    Antimicrobials: Anti-infectives (From admission, onward)   Start     Dose/Rate Route Frequency Ordered Stop   12/08/20 0800  Darunavir-Cobicisctat-Emtricitabine-Tenofovir Alafenamide (SYMTUZA) 800-150-200-10 MG TABS 1 tablet        1 tablet Oral Daily with breakfast 12/07/20 1216      12/08/20 0200  vancomycin (VANCOREADY) IVPB 1250 mg/250 mL  Status:  Discontinued        1,250 mg 166.7 mL/hr over 90 Minutes Intravenous Every 12 hours 12/07/20 1306 12/08/20 1053   12/07/20 1400  vancomycin (VANCOREADY) IVPB 1500 mg/300 mL        1,500 mg 150 mL/hr over 120 Minutes Intravenous  Once 12/07/20 1306 12/07/20 2006   12/07/20 1400  ceFEPIme (MAXIPIME) 2 g in sodium chloride 0.9 % 100 mL IVPB        2 g 200 mL/hr over 30 Minutes Intravenous Every 8 hours 12/07/20 1306     12/07/20 1000  cefTRIAXone (ROCEPHIN) 2 g in sodium chloride 0.9 % 100 mL IVPB  Status:  Discontinued        2 g 200 mL/hr over 30 Minutes Intravenous Every 24 hours 12/07/20 0958 12/07/20 1233   12/07/20 1000  azithromycin (ZITHROMAX) 500 mg in sodium chloride 0.9 % 250 mL IVPB        500 mg 250 mL/hr over 60 Minutes Intravenous Every 24 hours 12/07/20 0958     12/07/20 0915  cefTRIAXone (ROCEPHIN) 1 g in sodium chloride 0.9 % 100 mL IVPB        1 g 200 mL/hr over 30 Minutes Intravenous  Once 12/07/20 0903 12/07/20 2005   12/07/20 0915  azithromycin (ZITHROMAX) 500 mg in sodium chloride 0.9 % 250 mL IVPB        500 mg 250 mL/hr over 60 Minutes Intravenous  Once 12/07/20 0903 12/07/20 2005      PRN meds: albuterol, metoprolol tartrate, ondansetron (ZOFRAN) IV   Objective: Vitals:   12/08/20 0830 12/08/20 1039  BP: (!) 145/97 (!) 136/105  Pulse: 92 98  Resp: (!) 22 18  Temp: 98.2 F (36.8 C) 98.3 F (36.8 C)  SpO2: 97% 98%    Intake/Output Summary (Last 24 hours) at 12/08/2020 1403 Last data filed at 12/08/2020 1400 Gross per 24 hour  Intake 2695.34 ml  Output 350 ml  Net 2345.34 ml   Filed Weights   12/07/20 1219  Weight: 72.6 kg   Weight change:  Body mass index is 21.71 kg/m.   Physical Exam: General exam: Pleasant, middle-aged Caucasian male.  Not in distress Skin: No rashes, lesions or ulcers. HEENT: Atraumatic, normocephalic, no obvious bleeding Lungs: Clear to auscultation  bilaterally CVS: Regular rate and rhythm, no murmur GI/Abd soft, nontender, nondistended, bowels are present CNS: Alert, awake, oriented x3 Psychiatry: Mood appropriate Extremities: No pedal edema, no calf tenderness  Data Review: I have personally reviewed the laboratory data and studies available.  Recent Labs  Lab 12/07/20 0730 12/08/20 0850  WBC 16.5* 14.7*  NEUTROABS 14.2* 12.7*  HGB 16.2 14.2  HCT 46.2 41.8  MCV 92.8 95.0  PLT 132* 81*   Recent Labs  Lab 12/07/20 0730 12/08/20 0850  NA 132* 135  K 3.9 3.8  CL 101  105  CO2 22 24  GLUCOSE 102* 144*  BUN 6 7  CREATININE 0.86 0.79  CALCIUM 8.6* 7.9*    F/u labs ordered Unresulted Labs (From admission, onward)          Start     Ordered   12/09/20 0500  CBC with Differential/Platelet  Daily,   R      12/08/20 0758   12/09/20 8757  Basic metabolic panel  Daily,   R      12/08/20 0758   12/09/20 0500  Lactic acid, plasma  Tomorrow morning,   R        12/08/20 0758   12/07/20 1220  HIV-1 RNA quant-no reflex-bld  Add-on,   AD        12/07/20 1219   12/07/20 0750  Urine culture  (Undifferentiated presentation (screening labs and basic nursing orders))  ONCE - STAT,   STAT        12/07/20 0749   12/07/20 0749  Blood culture (routine single)  (Undifferentiated presentation (screening labs and basic nursing orders))  ONCE - STAT,   STAT        12/07/20 0749   12/07/20 0730  HIV-1/2 AB - differentiation  Once,   STAT        12/07/20 0730          Signed, Terrilee Croak, MD [facility] 12/08/2020

## 2020-12-09 DIAGNOSIS — R6520 Severe sepsis without septic shock: Secondary | ICD-10-CM | POA: Diagnosis not present

## 2020-12-09 DIAGNOSIS — A419 Sepsis, unspecified organism: Secondary | ICD-10-CM | POA: Diagnosis not present

## 2020-12-09 LAB — CBC WITH DIFFERENTIAL/PLATELET
Abs Immature Granulocytes: 0.03 10*3/uL (ref 0.00–0.07)
Basophils Absolute: 0 10*3/uL (ref 0.0–0.1)
Basophils Relative: 0 %
Eosinophils Absolute: 0.1 10*3/uL (ref 0.0–0.5)
Eosinophils Relative: 1 %
HCT: 38.6 % — ABNORMAL LOW (ref 39.0–52.0)
Hemoglobin: 13.3 g/dL (ref 13.0–17.0)
Immature Granulocytes: 0 %
Lymphocytes Relative: 18 %
Lymphs Abs: 1.5 10*3/uL (ref 0.7–4.0)
MCH: 32.4 pg (ref 26.0–34.0)
MCHC: 34.5 g/dL (ref 30.0–36.0)
MCV: 94.1 fL (ref 80.0–100.0)
Monocytes Absolute: 0.7 10*3/uL (ref 0.1–1.0)
Monocytes Relative: 8 %
Neutro Abs: 6.3 10*3/uL (ref 1.7–7.7)
Neutrophils Relative %: 73 %
Platelets: 79 10*3/uL — ABNORMAL LOW (ref 150–400)
RBC: 4.1 MIL/uL — ABNORMAL LOW (ref 4.22–5.81)
RDW: 13 % (ref 11.5–15.5)
WBC: 8.6 10*3/uL (ref 4.0–10.5)
nRBC: 0 % (ref 0.0–0.2)

## 2020-12-09 LAB — BASIC METABOLIC PANEL
Anion gap: 7 (ref 5–15)
BUN: 6 mg/dL (ref 6–20)
CO2: 24 mmol/L (ref 22–32)
Calcium: 8.4 mg/dL — ABNORMAL LOW (ref 8.9–10.3)
Chloride: 106 mmol/L (ref 98–111)
Creatinine, Ser: 0.85 mg/dL (ref 0.61–1.24)
GFR, Estimated: >60 mL/min (ref >60)
Glucose, Bld: 90 mg/dL (ref 70–99)
Potassium: 4.2 mmol/L (ref 3.5–5.1)
Sodium: 137 mmol/L (ref 135–145)

## 2020-12-09 LAB — LACTIC ACID, PLASMA: Lactic Acid, Venous: 1.5 mmol/L (ref 0.5–1.9)

## 2020-12-09 MED ORDER — CEFDINIR 300 MG PO CAPS: 300.0000 mg | ORAL_CAPSULE | Freq: Two times a day (BID) | ORAL | 0 refills | Status: AC

## 2020-12-09 MED ORDER — SACCHAROMYCES BOULARDII 250 MG PO CAPS: 250.0000 mg | ORAL_CAPSULE | Freq: Two times a day (BID) | ORAL | 0 refills | Status: AC

## 2020-12-09 MED ORDER — NYSTATIN 100000 UNIT/ML MT SUSP: 5.0000 mL | Freq: Four times a day (QID) | OROMUCOSAL | 0 refills | Status: AC

## 2020-12-09 NOTE — Progress Notes (Signed)
Patient discharged home. Discharge instructions given and explained to patient, he verbalized understanding, denies any pain/distress. No pressure injury noted. Accompanied home by family/friend.

## 2020-12-09 NOTE — Discharge Summary (Signed)
Physician Discharge Summary  Taylor James ZNB:[id number] DOB: 09/19/1970 DOA: 12/07/2020  PCP: Pcp, No  Admit date: 12/07/2020 Discharge date: 12/09/2020  Admitted From: Home Discharge disposition: Home   Code Status: Full Code  Diet Recommendation: Regular diet  Discharge Diagnosis:   Principal Problem:   Severe sepsis ([place]) Active Problems:   HIV (human immunodeficiency virus infection) ([place])   Anxiety   Hyperlipidemia   Tobacco abuse   CAP (community acquired pneumonia)   Chief Complaint  Patient presents with   Shortness of Breath   URI   Brief narrative: Caydn Justen is a 50 y.o. male with PMH significant for HIV, HTN, HLD, CVA in 2020, anxiety disorder, current everyday smoker. Patient presented to the ED on 12/08/2018 with complaints of productive cough, shortness of breath and chest pain for 1 to 2 days which felt like his previous episodes of pneumonia.  Smoker 1/2 pack of cigarettes per day. In the ED, patient was afebrile, heart rate in 130s, tachypneic in 20s, blood pressure in 120s. Labs with sodium 132, renal function normal, WC count elevated to 16.5, lactic acid elevated to 2.3. Chest x-ray showed right middle lobe pneumonia. Patient was started on treatment for sepsis secondary to pneumonia and admitted to hospital service. See below for details  Subjective: Patient was seen and examined this morning.   Walking on the hallway.  Wants to go home.  Not on supplemental oxygen.  No recurrence of fever.  WBC count and lactic acid level normal.  CD4 count low.     Assessment/Plan: Severe sepsis secondary to pneumonia -Immunocompromised patient presented with cough, shortness of breath, fever and has right middle lobe pneumonia on chest x-ray -Met severe sepsis criteria with tachycardia, tachypnea, leukocytosis and lactic acidosis. -Blood culture sent.  Started on broad-spectrum antibiotics with IV Vanco, IV cefepime and azithromycin.  Adequately  hydrated. -No growth in blood culture so far.  WBC count and lactic acid level at normalized.  Patient feels clinically better and is ready to go home.  Because of his immunocompromised status, we will discharge him 1 more week of oral Omnicef with probiotics. Recent Labs  Lab 12/07/20 0730 12/07/20 0825 12/07/20 1246 12/08/20 0850 12/09/20 0449  WBC 16.5*  --   --  14.7* 8.6  LATICACIDVEN  --  2.3* 2.2* 1.9 1.5   HIV -Noncompliant, supposed to be on Symtuza. -Says he ran out of Bactrim prophylaxis 2 months ago. -CD4 count low at 84. -Patient states he has enough supply of Bactrim and Symtuza at home.  Resume the same. -He said he has an upcoming appointment with ID here in [place].  Oral thrush -On nystatin  Essential hypertension -Lisinopril to resume post discharge  History of CVA Hyperlipidemia -Continue statin  Anxiety -Continue hydroxyzine  Current everyday smoker -Smokes 1/2 pack/day.  Advised cessation  Stable for discharge to home today.   Wound care:    Discharge Exam:   Vitals:   12/08/20 1039 12/08/20 1431 12/08/20 2030 12/09/20 0441  BP: (!) 136/105 (!) 131/100 (!) 138/104 (!) 153/107  Pulse: 98 99 92 86  Resp: '18 18 20 18  ' Temp: 98.3 F (36.8 C) 98.6 F (37 C) 98.7 F (37.1 C) 99.2 F (37.3 C)  TempSrc: Oral Oral Oral Oral  SpO2: 98% 96% 99% 97%  Weight:      Height:        Body mass index is 21.71 kg/m.  General exam: Middle-aged Caucasian male.  Not in physical distress Skin: No  rashes, lesions or ulcers. HEENT: Atraumatic, normocephalic, no obvious bleeding Lungs: Clear to auscultation bilaterally CVS: Regular rate and rhythm, no murmur GI/Abd soft, nontender, nondistended, bowel sound present CNS: Alert, awake, oriented x3 Psychiatry: Mood appropriate Extremities: No pedal edema, no calf tenderness  Follow ups:   Discharge Instructions    Diet general   Complete by: As directed    Increase activity slowly   Complete by:  As directed       Follow-up [facility] Follow up.   Contact information: [address] [place] [address] [phone]              Recommendations for Outpatient Follow-Up:   1. Follow-up with PCP as an outpatient 2. Follow-up with infectious disease as an outpatient  Discharge Instructions:  Follow with Primary MD Pcp, No in 7 days   Get CBC/BMP checked in next visit within 1 week by PCP or SNF MD ( we routinely change or add medications that can affect your baseline labs and fluid status, therefore we recommend that you get the mentioned basic workup next visit with your PCP, your PCP may decide not to get them or add new tests based on their clinical decision)  On your next visit with your PCP, please Get Medicines reviewed and adjusted.  Please request your PCP  to go over all Hospital Tests and Procedure/Radiological results at the follow up, please get all Hospital records sent to your Prim MD by signing hospital release before you go home.  Activity: As tolerated with Full fall precautions use walker/cane & assistance as needed  For Heart failure patients - Check your Weight same time everyday, if you gain over 2 pounds, or you develop in leg swelling, experience more shortness of breath or chest pain, call your Primary MD immediately. Follow Cardiac Low Salt Diet and 1.5 lit/day fluid restriction.  If you have smoked or chewed Tobacco in the last 2 yrs please stop smoking, stop any regular Alcohol  and or any Recreational drug use.  If you experience worsening of your admission symptoms, develop shortness of breath, life threatening emergency, suicidal or homicidal thoughts you must seek medical attention immediately by calling 911 or calling your MD immediately  if symptoms less severe.  You Must read complete instructions/literature along with all the possible adverse reactions/side effects for all  the Medicines you take and that have been prescribed to you. Take any new Medicines after you have completely understood and accpet all the possible adverse reactions/side effects.   Do not drive, operate heavy machinery, perform activities at heights, swimming or participation in water activities or provide baby sitting services if your were admitted for syncope or siezures until you have seen by Primary MD or a Neurologist and advised to do so again.  Do not drive when taking Pain medications.  Do not take more than prescribed Pain, Sleep and Anxiety Medications  Wear Seat belts while driving.   Please note You were cared for by a hospitalist during your hospital stay. If you have any questions about your discharge medications or the care you received while you were in the hospital after you are discharged, you can call the unit and asked to speak with the hospitalist on call if the hospitalist that took care of you is not available. Once you are discharged, your primary care physician will handle any further medical issues. Please note that NO REFILLS for any discharge  medications will be authorized once you are discharged, as it is imperative that you return to your primary care physician (or establish a relationship with a primary care physician if you do not have one) for your aftercare needs so that they can reassess your need for medications and monitor your lab values.    Allergies as of 12/09/2020   No Known Allergies     Medication List    TAKE these medications   atorvastatin 80 MG tablet Commonly known as: LIPITOR Take 80 mg by mouth daily.   cefdinir 300 MG capsule Commonly known as: OMNICEF Take 1 capsule (300 mg total) by mouth 2 (two) times daily for 7 days.   hydrOXYzine 25 MG capsule Commonly known as: VISTARIL Take 25 mg by mouth 3 (three) times daily.   lisinopril 20 MG tablet Commonly known as: ZESTRIL Take 20 mg by mouth daily.   nystatin 100000 UNIT/ML  suspension Commonly known as: MYCOSTATIN Take 5 mLs (500,000 Units total) by mouth 4 (four) times daily for 7 days.   saccharomyces boulardii 250 MG capsule Commonly known as: FLORASTOR Take 1 capsule (250 mg total) by mouth 2 (two) times daily for 7 days.   sulfamethoxazole-trimethoprim 800-160 MG tablet Commonly known as: BACTRIM DS Take 1 tablet by mouth daily.   Symtuza 800-150-200-10 MG Tabs Generic drug: Darunavir-Cobicisctat-Emtricitabine-Tenofovir Alafenamide Take 1 tablet by mouth daily with breakfast.       Time coordinating discharge: 35 minutes  The results of significant diagnostics from this hospitalization (including imaging, microbiology, ancillary and laboratory) are listed below for reference.    Procedures and Diagnostic Studies:   DG Chest 2 View  Result Date: 12/07/2020 CLINICAL DATA:  Shortness of breath. Similar symptoms to prior pneumonia HIV EXAM: CHEST - 2 VIEW COMPARISON:  None. FINDINGS: Airspace opacity at the right middle lobe. No edema effusion, or pneumothorax. Broad appearance of the right first rib which may be from prior trauma. No bony destruction IMPRESSION: Right middle lobe pneumonia. Followup PA and lateral chest X-ray is recommended in 3-4 weeks following trial of antibiotic therapy to ensure resolution. Electronically Signed   By: Monte Fantasia M.D.   On: 12/07/2020 08:39     Labs:   Basic Metabolic Panel: Recent Labs  Lab 12/07/20 0730 12/08/20 0850 12/09/20 0449  NA 132* 135 137  K 3.9 3.8 4.2  CL 101 105 106  CO2 '22 24 24  ' GLUCOSE 102* 144* 90  BUN '6 7 6  ' CREATININE 0.86 0.79 0.85  CALCIUM 8.6* 7.9* 8.4*   GFR Estimated Creatinine Clearance: 110.3 mL/min (by C-G formula based on SCr of 0.85 mg/dL). Liver Function Tests: Recent Labs  Lab 12/07/20 0730  AST 55*  ALT 40  ALKPHOS 57  BILITOT 0.7  PROT 8.9*  ALBUMIN 3.9   No results for input(s): LIPASE, AMYLASE in the last 168 hours. No results for input(s):  AMMONIA in the last 168 hours. Coagulation profile Recent Labs  Lab 12/07/20 0730  INR 1.0    CBC: Recent Labs  Lab 12/07/20 0730 12/08/20 0850 12/09/20 0449  WBC 16.5* 14.7* 8.6  NEUTROABS 14.2* 12.7* 6.3  HGB 16.2 14.2 13.3  HCT 46.2 41.8 38.6*  MCV 92.8 95.0 94.1  PLT 132* 81* 79*   Cardiac Enzymes: No results for input(s): CKTOTAL, CKMB, CKMBINDEX, TROPONINI in the last 168 hours. BNP: Invalid input(s): POCBNP CBG: No results for input(s): GLUCAP in the last 168 hours. D-Dimer No results for input(s): DDIMER in the last 72  hours. Hgb A1c No results for input(s): HGBA1C in the last 72 hours. Lipid Profile No results for input(s): CHOL, HDL, LDLCALC, TRIG, CHOLHDL, LDLDIRECT in the last 72 hours. Thyroid function studies No results for input(s): TSH, T4TOTAL, T3FREE, THYROIDAB in the last 72 hours.  Invalid input(s): FREET3 Anemia work up No results for input(s): VITAMINB12, FOLATE, FERRITIN, TIBC, IRON, RETICCTPCT in the last 72 hours. Microbiology Recent Results (from the past 240 hour(s))  Resp Panel by RT-PCR (Flu A&B, Covid) Nasopharyngeal Swab     Status: None   Collection Time: 12/07/20  7:50 AM   Specimen: Nasopharyngeal Swab; Nasopharyngeal(NP) swabs in vial transport medium  Result Value Ref Range Status   SARS Coronavirus 2 by RT PCR NEGATIVE NEGATIVE Final    Comment: (NOTE) SARS-CoV-2 target nucleic acids are NOT DETECTED.  The SARS-CoV-2 RNA is generally detectable in upper respiratory specimens during the acute phase of infection. The lowest concentration of SARS-CoV-2 viral copies this assay can detect is 138 copies/mL. A negative result does not preclude SARS-Cov-2 infection and should not be used as the sole basis for treatment or other patient management decisions. A negative result may occur with  improper specimen collection/handling, submission of specimen other than nasopharyngeal swab, presence of viral mutation(s) within the areas  targeted by this assay, and inadequate number of viral copies(<138 copies/mL). A negative result must be combined with clinical observations, patient history, and epidemiological information. The expected result is Negative.  Fact Sheet for Patients:  EntrepreneurPulse.com.au  Fact Sheet for Healthcare Providers:  IncredibleEmployment.be  This test is no t yet approved or cleared by the [place] FDA and  has been authorized for detection and/or diagnosis of SARS-CoV-2 by FDA under an Emergency Use Authorization (EUA). This EUA will remain  in effect (meaning this test can be used) for the duration of the COVID-19 declaration under Section 564(b)(1) of the Act, 21 U.S.C.section 360bbb-3(b)(1), unless the authorization is terminated  or revoked sooner.       Influenza A by PCR NEGATIVE NEGATIVE Final   Influenza B by PCR NEGATIVE NEGATIVE Final    Comment: (NOTE) The Xpert Xpress SARS-CoV-2/FLU/RSV plus assay is intended as an aid in the diagnosis of influenza from Nasopharyngeal swab specimens and should not be used as a sole basis for treatment. Nasal washings and aspirates are unacceptable for Xpert Xpress SARS-CoV-2/FLU/RSV testing.  Fact Sheet for Patients: EntrepreneurPulse.com.au  Fact Sheet for Healthcare Providers: IncredibleEmployment.be  This test is not yet approved or cleared by the [place] FDA and has been authorized for detection and/or diagnosis of SARS-CoV-2 by FDA under an Emergency Use Authorization (EUA). This EUA will remain in effect (meaning this test can be used) for the duration of the COVID-19 declaration under Section 564(b)(1) of the Act, 21 U.S.C. section 360bbb-3(b)(1), unless the authorization is terminated or revoked.  Performed at [facility], [place] [address] [place], [place] [address]   Blood culture (routine single)     Status: None  (Preliminary result)   Collection Time: 12/07/20  8:05 AM   Specimen: Left Antecubital; Blood  Result Value Ref Range Status   Specimen Description   Final    LEFT ANTECUBITAL Performed at [facility] [address], [place], [place] [address]    Special Requests   Final    BOTTLES DRAWN AEROBIC AND ANAEROBIC Blood Culture adequate volume Performed at [facility] [address] [place], [place] [address]    Culture  Final    NO GROWTH 1 DAY Performed at [facility], [place] [address], [place], [place] [address]    Report Status PENDING  Incomplete  Urine culture     Status: None   Collection Time: 12/07/20 11:53 AM   Specimen: In/Out Cath Urine  Result Value Ref Range Status   Specimen Description   Final    IN/OUT CATH URINE Performed at [place] [address], [place], [place] [address]    Special Requests   Final    NONE Performed at [facility], [place] [address] [place], [place] [address]    Culture   Final    NO GROWTH Performed at [facility], [place] [address] [place], [place] [address]    Report Status 12/08/2020 FINAL  Final  MRSA PCR Screening     Status: None   Collection Time: 12/07/20  1:03 PM   Specimen: Nasopharyngeal  Result Value Ref Range Status   MRSA by PCR NEGATIVE NEGATIVE Final    Comment:        The GeneXpert MRSA Assay (FDA approved for NASAL specimens only), is one component of a comprehensive MRSA colonization surveillance program. It is not intended to diagnose MRSA infection nor to guide or monitor treatment for MRSA infections. Performed at [facility], [place] [address], [place], [place] [address]   Expectorated Sputum Assessment w Gram Stain, Rflx to Resp Cult     Status: None   Collection Time: 12/07/20  2:00 PM   Specimen: Expectorated Sputum  Result Value Ref Range Status   Specimen Description EXPECTORATED SPUTUM   Final   Special Requests NONE  Final   Sputum evaluation   Final    THIS SPECIMEN IS ACCEPTABLE FOR SPUTUM CULTURE Performed at [facility], [place] [address], [place], [place] [address]    Report Status 12/07/2020 FINAL  Final  Culture, Respiratory w Gram Stain     Status: None (Preliminary result)   Collection Time: 12/07/20  2:00 PM  Result Value Ref Range Status   Specimen Description   Final    EXPECTORATED SPUTUM Performed at [facility] [address] [place], [place] [address]    Special Requests   Final    NONE Reflexed from [id number] Performed at [facility], [place] [address] [place], [place] [address]    Gram Stain   Final    MODERATE WBC PRESENT,BOTH PMN AND MONONUCLEAR FEW GRAM POSITIVE COCCI RARE GRAM VARIABLE ROD    Culture   Final    CULTURE REINCUBATED FOR BETTER GROWTH Performed at [facility], [place] [address] [place], [place] [address]    Report Status PENDING  Incomplete     Signed: Marlowe Aschoff Felisha Claytor  [facility] 12/09/2020, 11:36 AM

## 2020-12-09 NOTE — Progress Notes (Signed)
Physician Discharge Summary  Taylor James RWE:[id number] DOB: [birth year] DOA: 12/07/2020  PCP: Pcp, No  Admit date: 12/07/2020 Discharge date: 12/09/2020  Admitted From: Home Discharge disposition: Home   Code Status: Full Code  Diet Recommendation: Regular diet  Discharge Diagnosis:   Principal Problem:   Severe sepsis ([place]) Active Problems:   HIV (human immunodeficiency virus infection) ([place])   Anxiety   Hyperlipidemia   Tobacco abuse   CAP (community acquired pneumonia)   Chief Complaint  Patient presents with  . Shortness of Breath  . URI   Brief narrative: Taylor James is a 50 y.o. male with PMH significant for HIV, HTN, HLD, CVA in 2020, anxiety disorder, current everyday smoker. Patient presented to the ED on 12/08/2018 with complaints of productive cough, shortness of breath and chest pain for 1 to 2 days which felt like his previous episodes of pneumonia.  Smoker 1/2 pack of cigarettes per day. In the ED, patient was afebrile, heart rate in 130s, tachypneic in 20s, blood pressure in 120s. Labs with sodium 132, renal function normal, WC count elevated to 16.5, lactic acid elevated to 2.3. Chest x-ray showed right middle lobe pneumonia. Patient was started on treatment for sepsis secondary to pneumonia and admitted to hospital service. See below for details  Subjective: Patient was seen and examined this morning.   Walking on the hallway.  Wants to go home.  Not on supplemental oxygen.  No recurrence of fever.  WBC count and lactic acid level normal.  CD4 count low.     Assessment/Plan: Severe sepsis secondary to pneumonia -Immunocompromised patient presented with cough, shortness of breath, fever and has right middle lobe pneumonia on chest x-ray -Met severe sepsis criteria with tachycardia, tachypnea, leukocytosis and lactic acidosis. -Blood culture sent.  Started on broad-spectrum antibiotics with IV Vanco, IV cefepime and azithromycin.  Adequately  hydrated. -No growth in blood culture so far.  WBC count and lactic acid level at normalized.  Patient feels clinically better and is ready to go home.  Because of his immunocompromised status, we will discharge him 1 more week of oral Omnicef with probiotics. Recent Labs  Lab 12/07/20 0730 12/07/20 0825 12/07/20 1246 12/08/20 0850 12/09/20 0449  WBC 16.5*  --   --  14.7* 8.6  LATICACIDVEN  --  2.3* 2.2* 1.9 1.5   HIV -Noncompliant, supposed to be on Symtuza. -Says he ran out of Bactrim prophylaxis 2 months ago. -CD4 count low at 84. -Patient states he has enough supply of Bactrim and Symtuza at home.  Resume the same. -He said he has an upcoming appointment with ID here in [place].  Oral thrush -On nystatin  Essential hypertension -Lisinopril to resume post discharge  History of CVA Hyperlipidemia -Continue statin  Anxiety -Continue hydroxyzine  Current everyday smoker -Smokes 1/2 pack/day.  Advised cessation  Stable for discharge to home today.   Wound care:    Discharge Exam:   Vitals:   12/08/20 1039 12/08/20 1431 12/08/20 2030 12/09/20 0441  BP: (!) 136/105 (!) 131/100 (!) 138/104 (!) 153/107  Pulse: 98 99 92 86  Resp: '18 18 20 18  ' Temp: 98.3 F (36.8 C) 98.6 F (37 C) 98.7 F (37.1 C) 99.2 F (37.3 C)  TempSrc: Oral Oral Oral Oral  SpO2: 98% 96% 99% 97%  Weight:      Height:        Body mass index is 21.71 kg/m.  General exam: Middle-aged Caucasian male.  Not in physical distress Skin: No  rashes, lesions or ulcers. HEENT: Atraumatic, normocephalic, no obvious bleeding Lungs: Clear to auscultation bilaterally CVS: Regular rate and rhythm, no murmur GI/Abd soft, nontender, nondistended, bowel sound present CNS: Alert, awake, oriented x3 Psychiatry: Mood appropriate Extremities: No pedal edema, no calf tenderness  Follow ups:   Discharge Instructions    Diet general   Complete by: As directed    Increase activity slowly   Complete by:  As directed       Follow-up [facility].   Contact information: [address] [place] [address] [phone]              Recommendations for Outpatient Follow-Up:   1. Follow-up with PCP as an outpatient 2. Follow-up with infectious disease as an outpatient  Discharge Instructions:  Follow with Primary MD Pcp, No in 7 days   Get CBC/BMP checked in next visit within 1 week by PCP or SNF MD ( we routinely change or add medications that can affect your baseline labs and fluid status, therefore we recommend that you get the mentioned basic workup next visit with your PCP, your PCP may decide not to get them or add new tests based on their clinical decision)  On your next visit with your PCP, please Get Medicines reviewed and adjusted.  Please request your PCP  to go over all Hospital Tests and Procedure/Radiological results at the follow up, please get all Hospital records sent to your Prim MD by signing hospital release before you go home.  Activity: As tolerated with Full fall precautions use walker/cane & assistance as needed  For Heart failure patients - Check your Weight same time everyday, if you gain over 2 pounds, or you develop in leg swelling, experience more shortness of breath or chest pain, call your Primary MD immediately. Follow Cardiac Low Salt Diet and 1.5 lit/day fluid restriction.  If you have smoked or chewed Tobacco in the last 2 yrs please stop smoking, stop any regular Alcohol  and or any Recreational drug use.  If you experience worsening of your admission symptoms, develop shortness of breath, life threatening emergency, suicidal or homicidal thoughts you must seek medical attention immediately by calling 911 or calling your MD immediately  if symptoms less severe.  You Must read complete instructions/literature along with all the possible adverse reactions/side effects for all  the Medicines you take and that have been prescribed to you. Take any new Medicines after you have completely understood and accpet all the possible adverse reactions/side effects.   Do not drive, operate heavy machinery, perform activities at heights, swimming or participation in water activities or provide baby sitting services if your were admitted for syncope or siezures until you have seen by Primary MD or a Neurologist and advised to do so again.  Do not drive when taking Pain medications.  Do not take more than prescribed Pain, Sleep and Anxiety Medications  Wear Seat belts while driving.   Please note You were cared for by a hospitalist during your hospital stay. If you have any questions about your discharge medications or the care you received while you were in the hospital after you are discharged, you can call the unit and asked to speak with the hospitalist on call if the hospitalist that took care of you is not available. Once you are discharged, your primary care physician will handle any further medical issues. Please note that NO REFILLS for any discharge  medications will be authorized once you are discharged, as it is imperative that you return to your primary care physician (or establish a relationship with a primary care physician if you do not have one) for your aftercare needs so that they can reassess your need for medications and monitor your lab values.    Allergies as of 12/09/2020   No Known Allergies     Medication List    TAKE these medications   atorvastatin 80 MG tablet Commonly known as: LIPITOR Take 80 mg by mouth daily.   cefdinir 300 MG capsule Commonly known as: OMNICEF Take 1 capsule (300 mg total) by mouth 2 (two) times daily for 7 days.   hydrOXYzine 25 MG capsule Commonly known as: VISTARIL Take 25 mg by mouth 3 (three) times daily.   lisinopril 20 MG tablet Commonly known as: ZESTRIL Take 20 mg by mouth daily.   nystatin 100000 UNIT/ML  suspension Commonly known as: MYCOSTATIN Take 5 mLs (500,000 Units total) by mouth 4 (four) times daily for 7 days.   saccharomyces boulardii 250 MG capsule Commonly known as: FLORASTOR Take 1 capsule (250 mg total) by mouth 2 (two) times daily for 7 days.   sulfamethoxazole-trimethoprim 800-160 MG tablet Commonly known as: BACTRIM DS Take 1 tablet by mouth daily.   Symtuza 800-150-200-10 MG Tabs Generic drug: Darunavir-Cobicisctat-Emtricitabine-Tenofovir Alafenamide Take 1 tablet by mouth daily with breakfast.       Time coordinating discharge: 35 minutes  The results of significant diagnostics from this hospitalization (including imaging, microbiology, ancillary and laboratory) are listed below for reference.    Procedures and Diagnostic Studies:   DG Chest 2 View  Result Date: 12/07/2020 CLINICAL DATA:  Shortness of breath. Similar symptoms to prior pneumonia HIV EXAM: CHEST - 2 VIEW COMPARISON:  None. FINDINGS: Airspace opacity at the right middle lobe. No edema effusion, or pneumothorax. Broad appearance of the right first rib which may be from prior trauma. No bony destruction IMPRESSION: Right middle lobe pneumonia. Followup PA and lateral chest X-ray is recommended in 3-4 weeks following trial of antibiotic therapy to ensure resolution. Electronically Signed   By: Monte Fantasia M.D.   On: 12/07/2020 08:39     Labs:   Basic Metabolic Panel: Recent Labs  Lab 12/07/20 0730 12/08/20 0850 12/09/20 0449  NA 132* 135 137  K 3.9 3.8 4.2  CL 101 105 106  CO2 '22 24 24  ' GLUCOSE 102* 144* 90  BUN '6 7 6  ' CREATININE 0.86 0.79 0.85  CALCIUM 8.6* 7.9* 8.4*   GFR Estimated Creatinine Clearance: 110.3 mL/min (by C-G formula based on SCr of 0.85 mg/dL). Liver Function Tests: Recent Labs  Lab 12/07/20 0730  AST 55*  ALT 40  ALKPHOS 57  BILITOT 0.7  PROT 8.9*  ALBUMIN 3.9   No results for input(s): LIPASE, AMYLASE in the last 168 hours. No results for input(s):  AMMONIA in the last 168 hours. Coagulation profile Recent Labs  Lab 12/07/20 0730  INR 1.0    CBC: Recent Labs  Lab 12/07/20 0730 12/08/20 0850 12/09/20 0449  WBC 16.5* 14.7* 8.6  NEUTROABS 14.2* 12.7* 6.3  HGB 16.2 14.2 13.3  HCT 46.2 41.8 38.6*  MCV 92.8 95.0 94.1  PLT 132* 81* 79*   Cardiac Enzymes: No results for input(s): CKTOTAL, CKMB, CKMBINDEX, TROPONINI in the last 168 hours. BNP: Invalid input(s): POCBNP CBG: No results for input(s): GLUCAP in the last 168 hours. D-Dimer No results for input(s): DDIMER in the last 72  hours. Hgb A1c No results for input(s): HGBA1C in the last 72 hours. Lipid Profile No results for input(s): CHOL, HDL, LDLCALC, TRIG, CHOLHDL, LDLDIRECT in the last 72 hours. Thyroid function studies No results for input(s): TSH, T4TOTAL, T3FREE, THYROIDAB in the last 72 hours.  Invalid input(s): FREET3 Anemia work up No results for input(s): VITAMINB12, FOLATE, FERRITIN, TIBC, IRON, RETICCTPCT in the last 72 hours. Microbiology Recent Results (from the past 240 hour(s))  Resp Panel by RT-PCR (Flu A&B, Covid) Nasopharyngeal Swab     Status: None   Collection Time: 12/07/20  7:50 AM   Specimen: Nasopharyngeal Swab; Nasopharyngeal(NP) swabs in vial transport medium  Result Value Ref Range Status   SARS Coronavirus 2 by RT PCR NEGATIVE NEGATIVE Final    Comment: (NOTE) SARS-CoV-2 target nucleic acids are NOT DETECTED.  The SARS-CoV-2 RNA is generally detectable in upper respiratory specimens during the acute phase of infection. The lowest concentration of SARS-CoV-2 viral copies this assay can detect is 138 copies/mL. A negative result does not preclude SARS-Cov-2 infection and should not be used as the sole basis for treatment or other patient management decisions. A negative result may occur with  improper specimen collection/handling, submission of specimen other than nasopharyngeal swab, presence of viral mutation(s) within the areas  targeted by this assay, and inadequate number of viral copies(<138 copies/mL). A negative result must be combined with clinical observations, patient history, and epidemiological information. The expected result is Negative.  Fact Sheet for Patients:  EntrepreneurPulse.com.au  Fact Sheet for Healthcare Providers:  IncredibleEmployment.be  This test is no t yet approved or cleared by the [place] FDA and  has been authorized for detection and/or diagnosis of SARS-CoV-2 by FDA under an Emergency Use Authorization (EUA). This EUA will remain  in effect (meaning this test can be used) for the duration of the COVID-19 declaration under Section 564(b)(1) of the Act, 21 U.S.C.section 360bbb-3(b)(1), unless the authorization is terminated  or revoked sooner.       Influenza A by PCR NEGATIVE NEGATIVE Final   Influenza B by PCR NEGATIVE NEGATIVE Final    Comment: (NOTE) The Xpert Xpress SARS-CoV-2/FLU/RSV plus assay is intended as an aid in the diagnosis of influenza from Nasopharyngeal swab specimens and should not be used as a sole basis for treatment. Nasal washings and aspirates are unacceptable for Xpert Xpress SARS-CoV-2/FLU/RSV testing.  Fact Sheet for Patients: EntrepreneurPulse.com.au  Fact Sheet for Healthcare Providers: IncredibleEmployment.be  This test is not yet approved or cleared by the [place] FDA and has been authorized for detection and/or diagnosis of SARS-CoV-2 by FDA under an Emergency Use Authorization (EUA). This EUA will remain in effect (meaning this test can be used) for the duration of the COVID-19 declaration under Section 564(b)(1) of the Act, 21 U.S.C. section 360bbb-3(b)(1), unless the authorization is terminated or revoked.  Performed at [facility], [place] [address], [place], [place] [address]   Blood culture (routine single)     Status: None  (Preliminary result)   Collection Time: 12/07/20  8:05 AM   Specimen: Left Antecubital; Blood  Result Value Ref Range Status   Specimen Description   Final    LEFT ANTECUBITAL Performed at [facility] [address], [place], [place] [address]    Special Requests   Final    BOTTLES DRAWN AEROBIC AND ANAEROBIC Blood Culture adequate volume Performed at [facility] [address], [place], [place] [address]    Culture  Final    NO GROWTH 1 DAY Performed at [facility], [place] [address], [place], [place] [address]    Report Status PENDING  Incomplete  Urine culture     Status: None   Collection Time: 12/07/20 11:53 AM   Specimen: In/Out Cath Urine  Result Value Ref Range Status   Specimen Description   Final    IN/OUT CATH URINE Performed at [facility] [address], [place], [place] [address]    Special Requests   Final    NONE Performed at [facility], [place] [address] [place], [place] [address]    Culture   Final    NO GROWTH Performed at [facility], [place] [address] [place], [place] [address]    Report Status 12/08/2020 FINAL  Final  MRSA PCR Screening     Status: None   Collection Time: 12/07/20  1:03 PM   Specimen: Nasopharyngeal  Result Value Ref Range Status   MRSA by PCR NEGATIVE NEGATIVE Final    Comment:        The GeneXpert MRSA Assay (FDA approved for NASAL specimens only), is one component of a comprehensive MRSA colonization surveillance program. It is not intended to diagnose MRSA infection nor to guide or monitor treatment for MRSA infections. Performed at [facility], [place] [address] [place], [place] [address]   Expectorated Sputum Assessment w Gram Stain, Rflx to Resp Cult     Status: None   Collection Time: 12/07/20  2:00 PM   Specimen: Expectorated Sputum  Result Value Ref Range Status   Specimen Description EXPECTORATED SPUTUM   Final   Special Requests NONE  Final   Sputum evaluation   Final    THIS SPECIMEN IS ACCEPTABLE FOR SPUTUM CULTURE Performed at [facility], [place] [address] [place], [place] [address]    Report Status 12/07/2020 FINAL  Final  Culture, Respiratory w Gram Stain     Status: None (Preliminary result)   Collection Time: 12/07/20  2:00 PM  Result Value Ref Range Status   Specimen Description   Final    EXPECTORATED SPUTUM Performed at [facility] [address], [place], [place] [address]    Special Requests   Final    NONE Reflexed from [id number] Performed at [facility], [place] [address], [place], [place] [address]    Gram Stain   Final    MODERATE WBC PRESENT,BOTH PMN AND MONONUCLEAR FEW GRAM POSITIVE COCCI RARE GRAM VARIABLE ROD    Culture   Final    CULTURE REINCUBATED FOR BETTER GROWTH Performed at [facility], [place] [address] [place],  [address]    Report Status PENDING  Incomplete     Signed: Marlowe Aschoff Dahal  [facility] 12/09/2020, 9:15 AM

## 2020-12-10 LAB — CULTURE, RESPIRATORY W GRAM STAIN: Culture: NORMAL

## 2020-12-10 LAB — HIV-1/2 AB - DIFFERENTIATION
HIV 1 Ab: REACTIVE
HIV 2 Ab: NONREACTIVE

## 2020-12-11 ENCOUNTER — Telehealth: Payer: Self-pay

## 2020-12-11 NOTE — Telephone Encounter (Signed)
Attempted to reach patient. Patient's wife answered the phone and states patient will be available after 3pm today. No messages left patient's wife. Will attempt to reach patient again.  Valarie Cones

## 2020-12-11 NOTE — Telephone Encounter (Signed)
-----   Message from Odette Fraction, MD sent at 12/10/2020  6:29 PM EDT ----- Regarding: HIV patient Could you please reach out to this patient and see if he is following any provider for his HIV care? If he does not have one, please offer him one in our clinic.

## 2020-12-12 LAB — CULTURE, BLOOD (SINGLE)
Culture: NO GROWTH
Special Requests: ADEQUATE

## 2021-01-14 ENCOUNTER — Ambulatory Visit: Payer: PRIVATE HEALTH INSURANCE | Admitting: Infectious Diseases

## 2021-01-15 ENCOUNTER — Other Ambulatory Visit (HOSPITAL_COMMUNITY): Payer: Self-pay

## 2021-01-15 ENCOUNTER — Ambulatory Visit: Payer: PRIVATE HEALTH INSURANCE | Admitting: Infectious Diseases

## 2021-01-15 ENCOUNTER — Telehealth: Payer: Self-pay

## 2021-01-15 NOTE — Telephone Encounter (Signed)
[facility] Patient Product/process development scientist completed.    The patient is insured through [employer] and has a $0.00 copay.  We will continue to follow to see if copay assistance is needed.  Clearance Coots, CPhT Specialty Pharmacy Patient [facility] Phone: [phone] Fax:  [phone]

## 2021-01-16 ENCOUNTER — Telehealth: Payer: Self-pay

## 2021-01-16 NOTE — Telephone Encounter (Signed)
Attempted to call patient to reschedule missed appointment on 01/15/21, no answer and no secure voicemail set up.   Sandie Ano, RN

## 2022-01-20 IMAGING — CR DG CHEST 2V
3 series · 3 of 3 positions shown · non-contrast
Comparison: None.

CLINICAL DATA: Shortness of breath. Similar symptoms to prior
pneumonia HIV

EXAM:
CHEST - 2 VIEW

[w chest lat (1 of 2)]
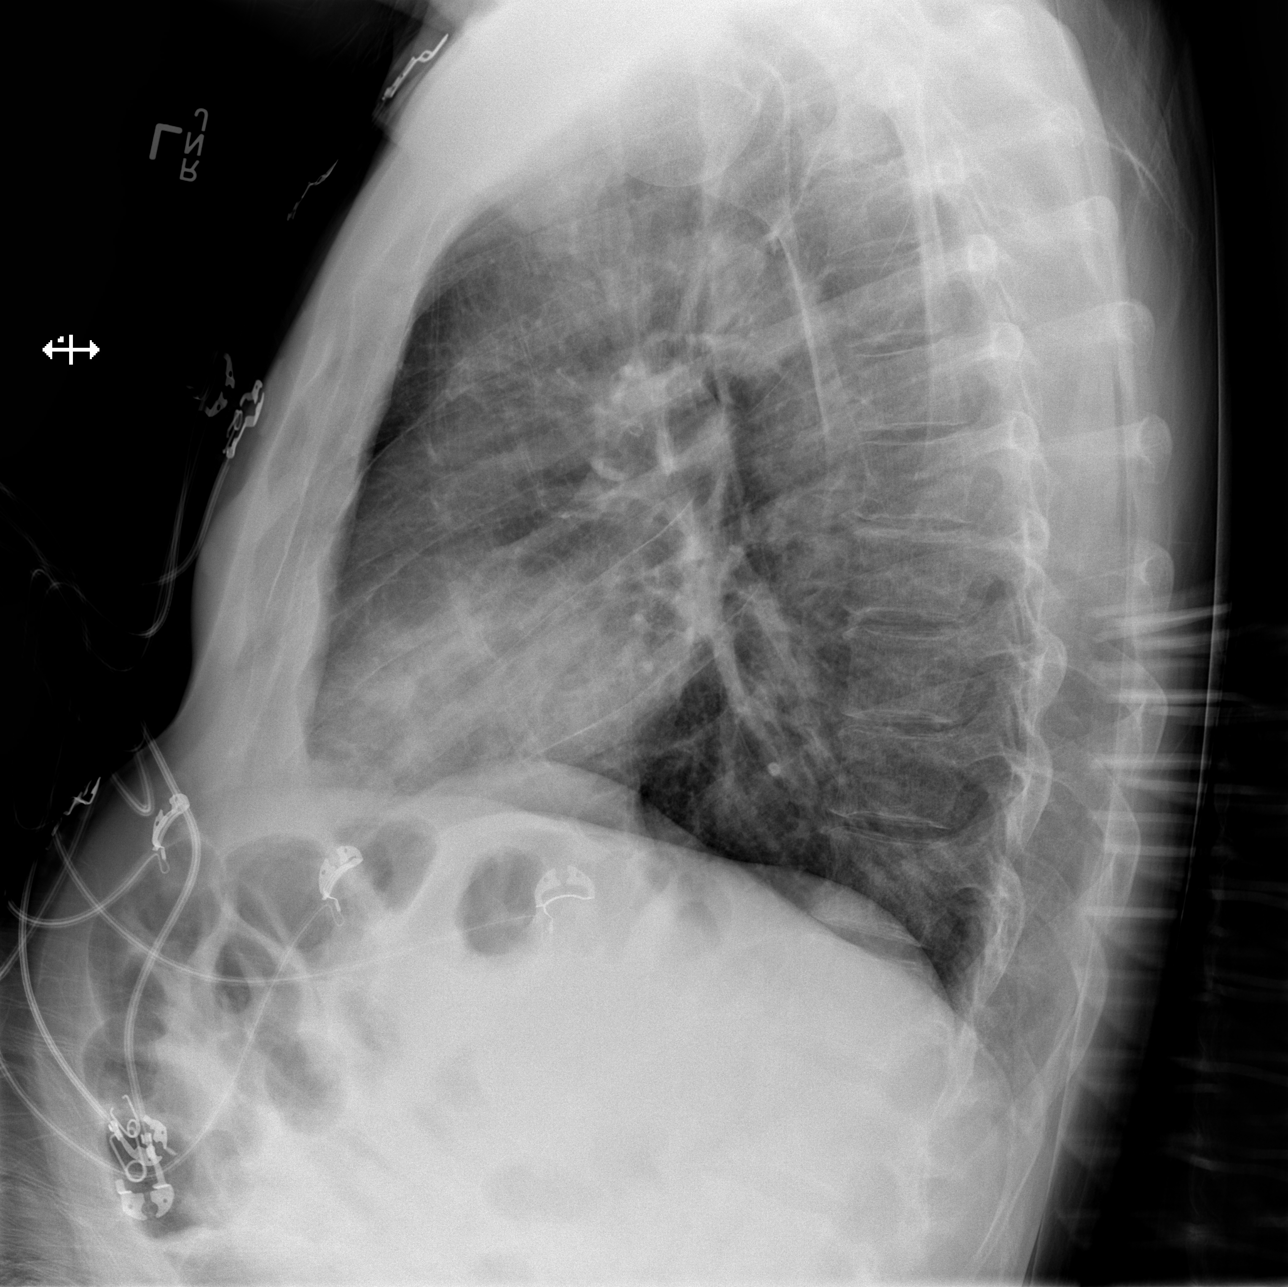

[w chest lat (2 of 2)]
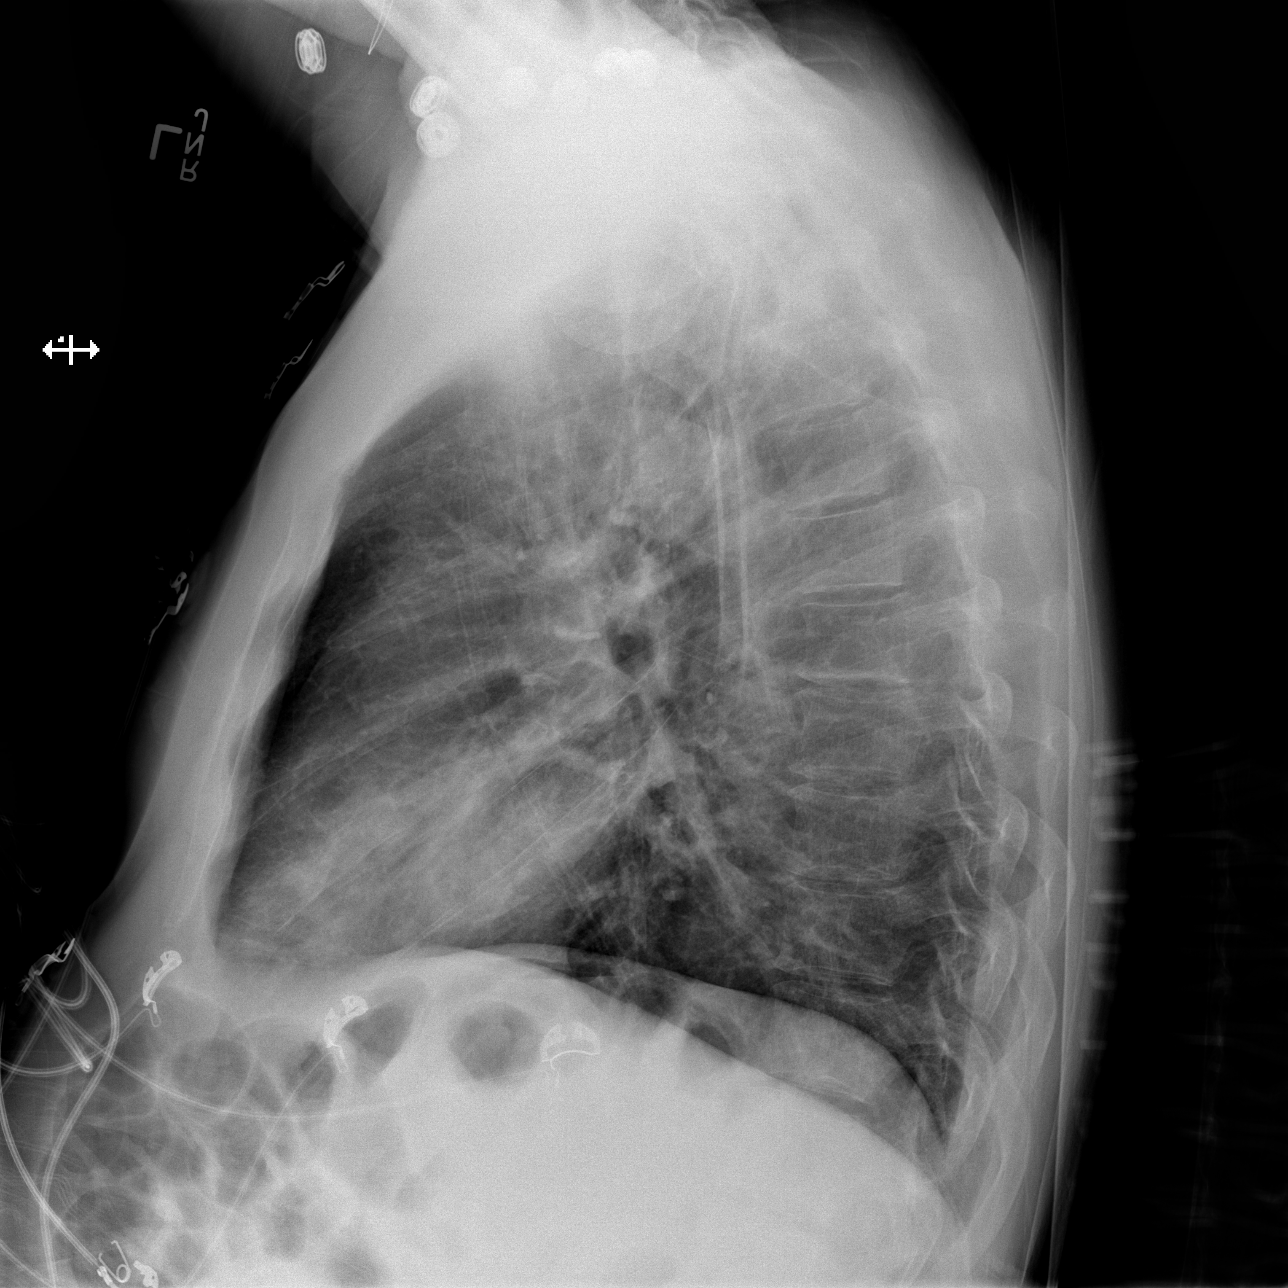

[x chest ap]
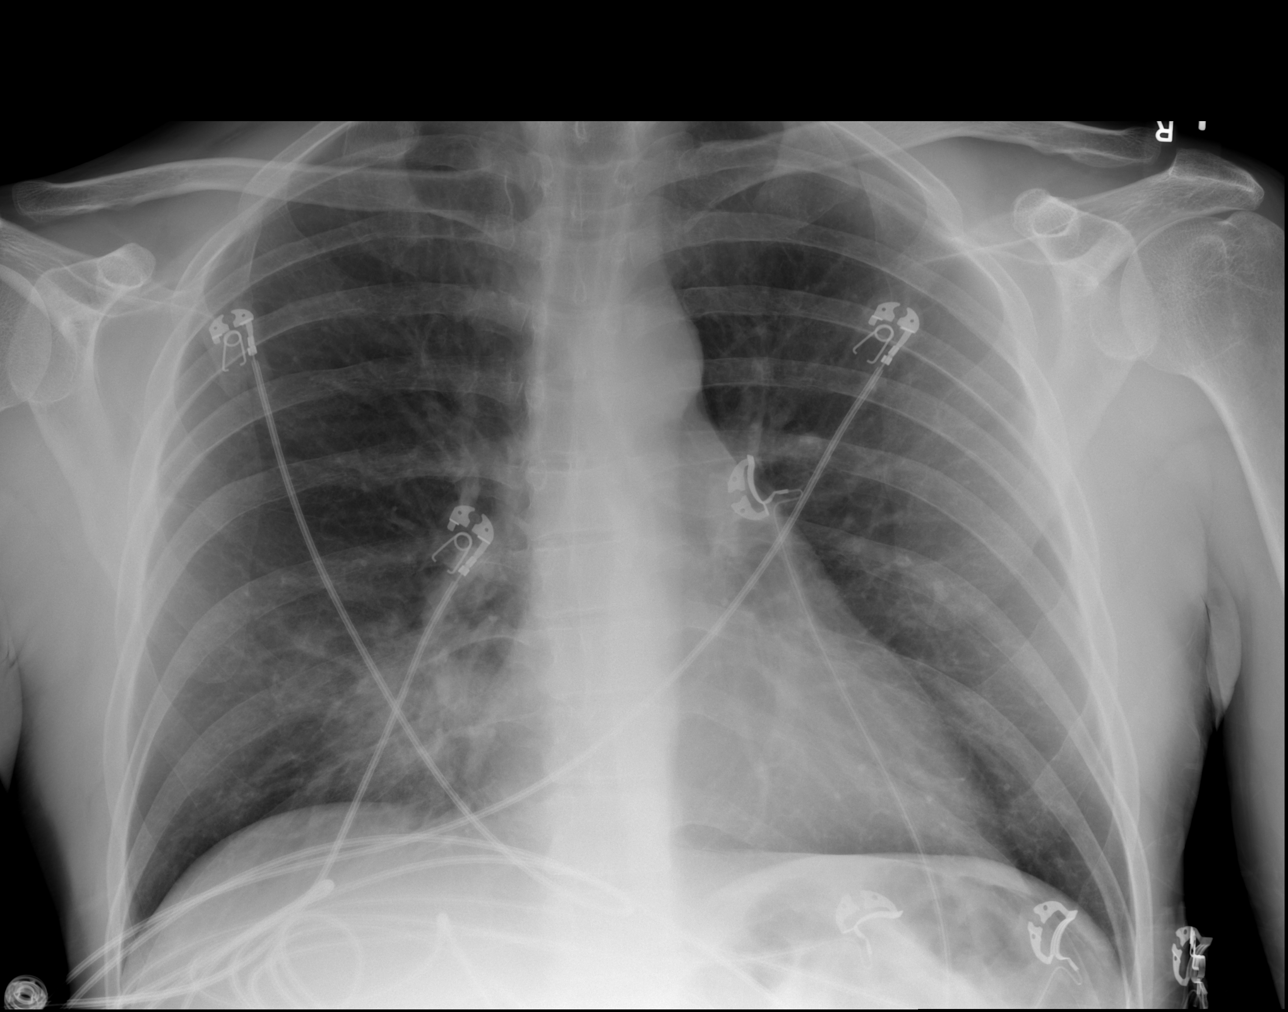

[3 of 3 positions shown; findings below may reference images not displayed]

FINDINGS: Airspace opacity at the right middle lobe. No edema effusion, or
pneumothorax. Broad appearance of the right first rib which may be
from prior trauma. No bony destruction
IMPRESSION: Right middle lobe pneumonia. Followup PA and lateral chest X-ray is
recommended in 3-4 weeks following trial of antibiotic therapy to
ensure resolution.
